# Patient Record
Sex: Male | Born: 1960 | Race: White | Hispanic: No | Marital: Single | Smoking: Former smoker
Health system: Southern US, Community
[De-identification: ages and names within clinical notes are randomized; demographics above are authoritative.]

## PROBLEM LIST (undated history)

## (undated) DIAGNOSIS — E119 Type 2 diabetes mellitus without complications: Secondary | ICD-10-CM

## (undated) DIAGNOSIS — L570 Actinic keratosis: Secondary | ICD-10-CM

## (undated) DIAGNOSIS — E785 Hyperlipidemia, unspecified: Secondary | ICD-10-CM

## (undated) HISTORY — DX: Type 2 diabetes mellitus without complications: E11.9

## (undated) HISTORY — PX: OTHER SURGICAL HISTORY: SHX169

## (undated) HISTORY — DX: Hyperlipidemia, unspecified: E78.5

## (undated) HISTORY — DX: Actinic keratosis: L57.0

---

## 2020-12-30 ENCOUNTER — Emergency Department: Payer: BC Managed Care – PPO

## 2020-12-30 ENCOUNTER — Inpatient Hospital Stay
Admission: EM | Admit: 2020-12-30 | Discharge: 2020-12-31 | DRG: 440 | Disposition: A | Discharge status: Home / Self Care | Payer: BC Managed Care – PPO | Attending: Internal Medicine | Admitting: Internal Medicine

## 2020-12-30 ENCOUNTER — Other Ambulatory Visit: Payer: Self-pay

## 2020-12-30 ENCOUNTER — Encounter: Payer: Self-pay | Admitting: Emergency Medicine

## 2020-12-30 ENCOUNTER — Inpatient Hospital Stay: Payer: BC Managed Care – PPO

## 2020-12-30 DIAGNOSIS — K859 Acute pancreatitis without necrosis or infection, unspecified: Secondary | ICD-10-CM | POA: Diagnosis present

## 2020-12-30 DIAGNOSIS — K760 Fatty (change of) liver, not elsewhere classified: Secondary | ICD-10-CM | POA: Diagnosis present

## 2020-12-30 DIAGNOSIS — Z20822 Contact with and (suspected) exposure to covid-19: Secondary | ICD-10-CM | POA: Diagnosis present

## 2020-12-30 DIAGNOSIS — K2980 Duodenitis without bleeding: Secondary | ICD-10-CM | POA: Diagnosis present

## 2020-12-30 DIAGNOSIS — R17 Unspecified jaundice: Secondary | ICD-10-CM | POA: Diagnosis not present

## 2020-12-30 DIAGNOSIS — K858 Other acute pancreatitis without necrosis or infection: Secondary | ICD-10-CM

## 2020-12-30 DIAGNOSIS — E875 Hyperkalemia: Secondary | ICD-10-CM | POA: Diagnosis present

## 2020-12-30 DIAGNOSIS — E119 Type 2 diabetes mellitus without complications: Secondary | ICD-10-CM

## 2020-12-30 DIAGNOSIS — E785 Hyperlipidemia, unspecified: Secondary | ICD-10-CM | POA: Diagnosis present

## 2020-12-30 DIAGNOSIS — Z87891 Personal history of nicotine dependence: Secondary | ICD-10-CM

## 2020-12-30 DIAGNOSIS — E876 Hypokalemia: Secondary | ICD-10-CM

## 2020-12-30 DIAGNOSIS — R1013 Epigastric pain: Secondary | ICD-10-CM | POA: Diagnosis not present

## 2020-12-30 DIAGNOSIS — E1165 Type 2 diabetes mellitus with hyperglycemia: Secondary | ICD-10-CM | POA: Diagnosis present

## 2020-12-30 LAB — COMPREHENSIVE METABOLIC PANEL
ALT: 24 U/L (ref 0–44)
AST: 39 U/L (ref 15–41)
Albumin: 3.7 g/dL (ref 3.5–5.0)
Alkaline Phosphatase: 85 U/L (ref 38–126)
Anion gap: 16 — ABNORMAL HIGH (ref 5–15)
BUN: 12 mg/dL (ref 6–20)
CO2: 19 mmol/L — ABNORMAL LOW (ref 22–32)
Calcium: 8.9 mg/dL (ref 8.9–10.3)
Chloride: 100 mmol/L (ref 98–111)
Creatinine, Ser: 0.83 mg/dL (ref 0.61–1.24)
GFR, Estimated: >60 mL/min (ref >60)
Glucose, Bld: 228 mg/dL — ABNORMAL HIGH (ref 70–99)
Potassium: 5.8 mmol/L — ABNORMAL HIGH (ref 3.5–5.1)
Sodium: 135 mmol/L (ref 135–145)
Total Bilirubin: 2.9 mg/dL — ABNORMAL HIGH (ref 0.3–1.2)
Total Protein: 6.4 g/dL — ABNORMAL LOW (ref 6.5–8.1)

## 2020-12-30 LAB — CBC
HCT: 47.9 % (ref 39.0–52.0)
Hemoglobin: 17.6 g/dL — ABNORMAL HIGH (ref 13.0–17.0)
MCH: 33.7 pg (ref 26.0–34.0)
MCHC: 36.7 g/dL — ABNORMAL HIGH (ref 30.0–36.0)
MCV: 91.8 fL (ref 80.0–100.0)
Platelets: 214 10*3/uL (ref 150–400)
RBC: 5.22 MIL/uL (ref 4.22–5.81)
RDW: 13 % (ref 11.5–15.5)
WBC: 14.7 10*3/uL — ABNORMAL HIGH (ref 4.0–10.5)
nRBC: 0 % (ref 0.0–0.2)

## 2020-12-30 LAB — URINALYSIS, COMPLETE (UACMP) WITH MICROSCOPIC
Bilirubin Urine: NEGATIVE
Glucose, UA: >=500 mg/dL — AB
Hgb urine dipstick: NEGATIVE
Ketones, ur: 80 mg/dL — AB
Leukocytes,Ua: NEGATIVE
Nitrite: NEGATIVE
Protein, ur: NEGATIVE mg/dL
Specific Gravity, Urine: 1.041 — ABNORMAL HIGH (ref 1.005–1.030)
Squamous Epithelial / HPF: NONE SEEN (ref 0–5)
pH: 5 (ref 5.0–8.0)

## 2020-12-30 LAB — RESP PANEL BY RT-PCR (FLU A&B, COVID) ARPGX2
Influenza A by PCR: NEGATIVE
Influenza B by PCR: NEGATIVE
SARS Coronavirus 2 by RT PCR: NEGATIVE

## 2020-12-30 LAB — TROPONIN I (HIGH SENSITIVITY)
Troponin I (High Sensitivity): 12 ng/L (ref <18)
Troponin I (High Sensitivity): 10 ng/L (ref <18)

## 2020-12-30 LAB — LIPASE, BLOOD: Lipase: 1462 U/L — ABNORMAL HIGH (ref 11–51)

## 2020-12-30 MED ORDER — INSULIN ASPART 100 UNIT/ML IJ SOLN
0.0000 [IU] | Freq: Four times a day (QID) | INTRAMUSCULAR | Status: DC
  Administered 2020-12-31 (×2): 3 [IU] via SUBCUTANEOUS
  Filled 2020-12-30 (×2): qty 1

## 2020-12-30 MED ORDER — SODIUM CHLORIDE 0.9 % IV SOLN: INTRAVENOUS | Status: DC

## 2020-12-30 MED ORDER — ENOXAPARIN SODIUM 60 MG/0.6ML IJ SOSY
45.0000 mg | PREFILLED_SYRINGE | INTRAMUSCULAR | Status: DC
  Administered 2020-12-30: 45 mg via SUBCUTANEOUS
  Filled 2020-12-30: qty 0.6

## 2020-12-30 MED ORDER — KETOROLAC TROMETHAMINE 30 MG/ML IJ SOLN: 15.0000 mg | Freq: Four times a day (QID) | INTRAMUSCULAR | Status: DC | PRN

## 2020-12-30 MED ORDER — SODIUM CHLORIDE 0.9 % IV BOLUS
1000.0000 mL | Freq: Once | INTRAVENOUS | Status: AC
  Administered 2020-12-30: 1000 mL via INTRAVENOUS

## 2020-12-30 MED ORDER — ACETAMINOPHEN 325 MG PO TABS
650.0000 mg | ORAL_TABLET | Freq: Four times a day (QID) | ORAL | Status: DC | PRN
  Administered 2020-12-30: 23:00:00 650 mg via ORAL
  Filled 2020-12-30: qty 2

## 2020-12-30 MED ORDER — ONDANSETRON HCL 4 MG PO TABS: 4.0000 mg | ORAL_TABLET | Freq: Four times a day (QID) | ORAL | Status: DC | PRN

## 2020-12-30 MED ORDER — SODIUM POLYSTYRENE SULFONATE 15 GM/60ML PO SUSP
30.0000 g | Freq: Once | ORAL | Status: AC
  Administered 2020-12-30: 23:00:00 30 g via ORAL
  Filled 2020-12-30: qty 120

## 2020-12-30 MED ORDER — ACETAMINOPHEN 650 MG RE SUPP: 650.0000 mg | Freq: Four times a day (QID) | RECTAL | Status: DC | PRN

## 2020-12-30 MED ORDER — SODIUM BICARBONATE 8.4 % IV SOLN
50.0000 meq | Freq: Once | INTRAVENOUS | Status: AC
  Administered 2020-12-30: 50 meq via INTRAVENOUS
  Filled 2020-12-30: qty 50

## 2020-12-30 MED ORDER — TRAZODONE HCL 50 MG PO TABS: 25.0000 mg | ORAL_TABLET | Freq: Every evening | ORAL | Status: DC | PRN

## 2020-12-30 MED ORDER — ONDANSETRON HCL 4 MG/2ML IJ SOLN: 4.0000 mg | Freq: Four times a day (QID) | INTRAMUSCULAR | Status: DC | PRN

## 2020-12-30 MED ORDER — MORPHINE SULFATE (PF) 2 MG/ML IV SOLN: 2.0000 mg | INTRAVENOUS | Status: DC | PRN

## 2020-12-30 MED ORDER — MAGNESIUM HYDROXIDE 400 MG/5ML PO SUSP
30.0000 mL | Freq: Every day | ORAL | Status: DC | PRN
  Filled 2020-12-30: qty 30

## 2020-12-30 MED ORDER — IOHEXOL 350 MG/ML SOLN
100.0000 mL | Freq: Once | INTRAVENOUS | Status: AC | PRN
  Administered 2020-12-30: 80 mL via INTRAVENOUS

## 2020-12-30 MED ORDER — SODIUM CHLORIDE 0.9 % IV SOLN: Freq: Once | INTRAVENOUS | Status: DC

## 2020-12-30 MED ORDER — SODIUM CHLORIDE 0.9 % IV BOLUS: 1000.0000 mL | Freq: Once | INTRAVENOUS | Status: DC

## 2020-12-30 NOTE — ED Notes (Signed)
Pt to ED POV c/o abdominal pain. Pt states abdominal pain started yesterday morning and feels tender in all quadrants, Pt noted to have abdominal distention. Denies NVD at this time A&Ox4, NAD, VSS

## 2020-12-30 NOTE — ED Triage Notes (Signed)
Pt reports pain to his left upper quadrant since yesterday. Pt reports went to UC and was advised to come to the ED for evaluation.

## 2020-12-30 NOTE — ED Notes (Signed)
Care transferred, report received from [place], [place]

## 2020-12-30 NOTE — ED Notes (Addendum)
Called lab again at this time. Lab states that it is being spun down now and they have to spin it down multiple time. Explained that this blood work was the delay for his CT scan. Lab verbalized understanding at this time. Updated pt about the delay

## 2020-12-30 NOTE — ED Provider Notes (Signed)
-----------------------------------------   7:14 PM on 12/30/2020 ----------------------------------------- Patient's work-up shows acute pancreatitis CT evidence of acute pancreatitis as well as a lipase greater than 1400.  Patient continues to have epigastric pain.  We will admit the patient to the hospitalist service for bowel rest.  We will begin IV hydration.  Lab work did show mild hyperkalemia initially we will repeat this as the patient has received IV fluids.  Patient does not drink alcohol.   Harvest Dark, MD 12/30/20 [phone]

## 2020-12-30 NOTE — ED Notes (Signed)
Lab called x3 to notify about hemolyzed lab draw. Phlebotomist requested to draw labs at this time.

## 2020-12-30 NOTE — ED Notes (Signed)
Hospitalist at the bedside 
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
## 2020-12-30 NOTE — ED Notes (Signed)
Patient transported upstairs to his room via wheelchair by RN, stable at transfer

## 2020-12-30 NOTE — ED Provider Notes (Signed)
[facility] Emergency Department Provider Note  ____________________________________________   Event Date/Time   First MD Initiated Contact with Patient 12/30/20 1239     (approximate)  I have reviewed the triage vital signs and the nursing notes.   HISTORY  Chief Complaint Abdominal Pain    HPI Tyler Powers is a 60 y.o. male who is otherwise healthy comes in with abdominal pain.  Patient reports having abdominal pain that started yesterday.  He states that initially was over his entire abdomen.  He states that it was kind of in the lower abdomen and then wrapped up into the top of the abdomen.  He states that it felt more distended or bloated.  This was constant, nothing made it better, nothing made it worse.  He states it is gradually gotten better and now he just has a little bit of pain in the left upper quadrant but still feels distended.  Denies any chest pain or shortness of breath.  Last bowel movement was Friday and soft in nature.  Denies any other urinary symptoms.  Denies any abdominal surgeries.  Denies any daily alcohol drinking.  Quit smoking a few years ago.    History reviewed. No pertinent past medical history.  There are no problems to display for this patient.   History reviewed. No pertinent surgical history.  Prior to Admission medications   Not on File    Allergies Patient has no allergy information on record.  No family history on file.  Social History No alcohol, prior smoking   Review of Systems Constitutional: No fever/chills Eyes: No visual changes. ENT: No sore throat. Cardiovascular: Denies chest pain. Respiratory: Denies shortness of breath. Gastrointestinal: Abdominal pain, distention Genitourinary: Negative for dysuria. Musculoskeletal: Negative for back pain. Skin: Negative for rash. Neurological: Negative for headaches, focal weakness or numbness. All other ROS  negative ____________________________________________   PHYSICAL EXAM:  VITAL SIGNS: ED Triage Vitals  Enc Vitals Group     BP 12/30/20 0954 123/79     Pulse Rate 12/30/20 0954 92     Resp 12/30/20 0954 18     Temp 12/30/20 0954 98.2 F (36.8 C)     Temp src --      SpO2 12/30/20 0954 96 %     Weight 12/30/20 0914 200 lb (90.7 kg)     Height 12/30/20 0914 '5\' 8"'$  (1.727 m)     Head Circumference --      Peak Flow --      Pain Score 12/30/20 0914 2     Pain Loc --      Pain Edu? --      Excl. in [place]? --     Constitutional: Alert and oriented. Well appearing and in no acute distress. Eyes: Conjunctivae are normal. EOMI. Head: Atraumatic. Nose: No congestion/rhinnorhea. Mouth/Throat: Mucous membranes are moist.   Neck: No stridor. Trachea Midline. FROM Cardiovascular: Normal rate, regular rhythm. Grossly normal heart sounds.  Good peripheral circulation. Respiratory: Normal respiratory effort.  No retractions. Lungs CTAB. Gastrointestinal: Slightly distended but soft to palpation with a little bit of left upper quadrant tenderness without any rebound or guarding.  No abdominal bruits.  Musculoskeletal: No lower extremity tenderness nor edema.  No joint effusions. Neurologic:  Normal speech and language. No gross focal neurologic deficits are appreciated.  Skin:  Skin is warm, dry and intact. No rash noted. Psychiatric: Mood and affect are normal. Speech and behavior are normal. GU: Deferred   ____________________________________________   LABS (  all labs ordered are listed, but only abnormal results are displayed)  Labs Reviewed  CBC - Abnormal; Notable for the following components:      Result Value   WBC 14.7 (*)    Hemoglobin 17.6 (*)    MCHC 36.7 (*)    All other components within normal limits  URINALYSIS, COMPLETE (UACMP) WITH MICROSCOPIC - Abnormal; Notable for the following components:   Color, Urine YELLOW (*)    APPearance CLEAR (*)    Specific Gravity,  Urine 1.041 (*)    Glucose, UA >=500 (*)    Ketones, ur 80 (*)    Bacteria, UA RARE (*)    All other components within normal limits  COMPREHENSIVE METABOLIC PANEL  LIPASE, BLOOD   ____________________________________________   ED ECG REPORT I, Vanessa [place], the attending physician, personally viewed and interpreted this ECG.  Normal sinus rate of 89, no ST elevation, T wave version lead III, normal intervals ____________________________________________  RADIOLOGY Robert Bellow, personally viewed and evaluated these images (plain radiographs) as part of my medical decision making, as well as reviewing the written report by the radiologist.  ED MD interpretation: pending   Official radiology report(s): No results found.  ____________________________________________   PROCEDURES  Procedure(s) performed (including Critical Care):  .1-3 Lead EKG Interpretation  Date/Time: 12/30/2020 4:16 PM Performed by: Vanessa Rincon, MD Authorized by: Vanessa Lewellen, MD     Interpretation: normal     ECG rate:  80s   ECG rate assessment: normal     Rhythm: sinus rhythm     Ectopy: none     Conduction: normal     ____________________________________________   INITIAL IMPRESSION / ASSESSMENT AND PLAN / ED COURSE  Tyler Powers was evaluated in Emergency Department on 12/30/2020 for the symptoms described in the history of present illness. He was evaluated in the context of the global COVID-19 pandemic, which necessitated consideration that the patient might be at risk for infection with the SARS-CoV-2 virus that causes COVID-19. Institutional protocols and algorithms that pertain to the evaluation of patients at risk for COVID-19 are in a state of rapid change based on information released by regulatory bodies including the CDC and federal and state organizations. These policies and algorithms were followed during the patient's care in the ED.    Patient is a well-appearing 60 year old  who comes in with abdominal distention and abdominal pain.  Does not sound like ACS but will get EKG and cardiac markers to confirm.  Labs ordered to evaluate for choledocholithiasis, pancreatitis.  Will get CT abdomen to evaluate for obstruction, perforation or other acute pathology.  Patient is declining pain medication at this time.  We will keep patient the cardiac monitor to evaluate for any arrhythmia  Patient's labs hemolyzed multiple times leading to a delay in care.  His initial troponin was reassuring and does not sound cardiac in nature I do not feel like need to get another troponin specially given all the hemolysis.  CT imaging is pending and the rest of his labs and patient will be handed off to oncoming team         ____________________________________________   FINAL CLINICAL IMPRESSION(S) / ED DIAGNOSES   Final diagnoses:  None      MEDICATIONS GIVEN DURING THIS VISIT:  Medications - No data to display   ED Discharge Orders     None        Note:  This document was prepared using Dragon voice  recognition software and may include unintentional dictation errors.    Vanessa [place], MD 12/30/20 [phone]

## 2020-12-30 NOTE — ED Notes (Signed)
Called lab for an update on the pt's CMP/Lipase blood work, lab states that it is in process at this time.

## 2020-12-30 NOTE — ED Notes (Signed)
Lab at bedside

## 2020-12-30 NOTE — ED Notes (Signed)
CT scan called at this time to get pt for CT scan, lab work has been resulted.

## 2020-12-31 ENCOUNTER — Encounter: Payer: Self-pay | Admitting: Family Medicine

## 2020-12-31 DIAGNOSIS — K859 Acute pancreatitis without necrosis or infection, unspecified: Principal | ICD-10-CM

## 2020-12-31 LAB — COMPREHENSIVE METABOLIC PANEL WITH GFR
ALT: 12 U/L (ref 0–44)
AST: 24 U/L (ref 15–41)
Albumin: 3.3 g/dL — ABNORMAL LOW (ref 3.5–5.0)
Alkaline Phosphatase: 78 U/L (ref 38–126)
Anion gap: 13 (ref 5–15)
BUN: 11 mg/dL (ref 6–20)
CO2: 21 mmol/L — ABNORMAL LOW (ref 22–32)
Calcium: 8.6 mg/dL — ABNORMAL LOW (ref 8.9–10.3)
Chloride: 103 mmol/L (ref 98–111)
Creatinine, Ser: 0.71 mg/dL (ref 0.61–1.24)
GFR, Estimated: >60 mL/min
Glucose, Bld: 193 mg/dL — ABNORMAL HIGH (ref 70–99)
Potassium: 4.4 mmol/L (ref 3.5–5.1)
Sodium: 137 mmol/L (ref 135–145)
Total Bilirubin: 1.9 mg/dL — ABNORMAL HIGH (ref 0.3–1.2)
Total Protein: 6.1 g/dL — ABNORMAL LOW (ref 6.5–8.1)

## 2020-12-31 LAB — CBC
HCT: 45.9 % (ref 39.0–52.0)
Hemoglobin: 16 g/dL (ref 13.0–17.0)
MCH: 32.2 pg (ref 26.0–34.0)
MCHC: 34.9 g/dL (ref 30.0–36.0)
MCV: 92.4 fL (ref 80.0–100.0)
Platelets: 202 K/uL (ref 150–400)
RBC: 4.97 MIL/uL (ref 4.22–5.81)
RDW: 13.3 % (ref 11.5–15.5)
WBC: 13 K/uL — ABNORMAL HIGH (ref 4.0–10.5)
nRBC: 0 % (ref 0.0–0.2)

## 2020-12-31 LAB — HEMOGLOBIN A1C
Hgb A1c MFr Bld: 12.8 % — ABNORMAL HIGH (ref 4.8–5.6)
Mean Plasma Glucose: 320.66 mg/dL

## 2020-12-31 LAB — LIPID PANEL
Cholesterol: 530 mg/dL — ABNORMAL HIGH (ref 0–200)
HDL: 23 mg/dL — ABNORMAL LOW (ref >40)
LDL Cholesterol: UNDETERMINED mg/dL (ref 0–99)
Triglycerides: 1554 mg/dL — ABNORMAL HIGH (ref <150)
VLDL: UNDETERMINED mg/dL (ref 0–40)

## 2020-12-31 LAB — LDL CHOLESTEROL, DIRECT: Direct LDL: 102.1 mg/dL — ABNORMAL HIGH (ref 0–99)

## 2020-12-31 LAB — GLUCOSE, CAPILLARY
Glucose-Capillary: 177 mg/dL — ABNORMAL HIGH (ref 70–99)
Glucose-Capillary: 228 mg/dL — ABNORMAL HIGH (ref 70–99)
Glucose-Capillary: 234 mg/dL — ABNORMAL HIGH (ref 70–99)
Glucose-Capillary: 236 mg/dL — ABNORMAL HIGH (ref 70–99)

## 2020-12-31 LAB — HIV ANTIBODY (ROUTINE TESTING W REFLEX): HIV Screen 4th Generation wRfx: NONREACTIVE

## 2020-12-31 LAB — LIPASE, BLOOD: Lipase: 77 U/L — ABNORMAL HIGH (ref 11–51)

## 2020-12-31 MED ORDER — INSULIN ASPART 100 UNIT/ML IJ SOLN: 0.0000 [IU] | Freq: Every day | INTRAMUSCULAR | Status: DC

## 2020-12-31 MED ORDER — LIVING WELL WITH DIABETES BOOK
Freq: Once | Status: AC
  Filled 2020-12-31: qty 1

## 2020-12-31 MED ORDER — OMEGA-3-ACID ETHYL ESTERS 1 G PO CAPS: 2.0000 g | ORAL_CAPSULE | Freq: Two times a day (BID) | ORAL | Status: DC

## 2020-12-31 MED ORDER — MORPHINE SULFATE (PF) 2 MG/ML IV SOLN
2.0000 mg | INTRAVENOUS | Status: DC | PRN
Start: 2020-12-31 — End: 2020-12-31

## 2020-12-31 MED ORDER — INSULIN GLARGINE 100 UNIT/ML SOLOSTAR PEN: 10.0000 [IU] | PEN_INJECTOR | Freq: Every day | SUBCUTANEOUS | 11 refills | Status: DC

## 2020-12-31 MED ORDER — ROSUVASTATIN CALCIUM 10 MG PO TABS: 10.0000 mg | ORAL_TABLET | Freq: Every day | ORAL | Status: DC

## 2020-12-31 MED ORDER — INSULIN ASPART 100 UNIT/ML IJ SOLN
0.0000 [IU] | Freq: Three times a day (TID) | INTRAMUSCULAR | Status: DC
  Administered 2020-12-31: 3 [IU] via SUBCUTANEOUS
  Administered 2020-12-31: 16:00:00 5 [IU] via SUBCUTANEOUS
  Filled 2020-12-31 (×2): qty 1

## 2020-12-31 MED ORDER — METFORMIN HCL ER 500 MG PO TB24: 500.0000 mg | ORAL_TABLET | Freq: Two times a day (BID) | ORAL | 1 refills | Status: DC

## 2020-12-31 MED ORDER — PEN NEEDLES 31G X 5 MM MISC: 10.0000 [IU] | Freq: Every day | 1 refills | Status: DC

## 2020-12-31 MED ORDER — INSULIN ASPART 100 UNIT/ML IJ SOLN
4.0000 [IU] | Freq: Three times a day (TID) | INTRAMUSCULAR | Status: DC
  Administered 2020-12-31 (×2): 4 [IU] via SUBCUTANEOUS
  Filled 2020-12-31 (×2): qty 1

## 2020-12-31 MED ORDER — BLOOD GLUCOSE MONITOR KIT: PACK | 0 refills | Status: AC

## 2020-12-31 MED ORDER — OMEGA-3-ACID ETHYL ESTERS 1 G PO CAPS: 2.0000 g | ORAL_CAPSULE | Freq: Two times a day (BID) | ORAL | 1 refills | Status: DC

## 2020-12-31 MED ORDER — ROSUVASTATIN CALCIUM 10 MG PO TABS
10.0000 mg | ORAL_TABLET | Freq: Every day | ORAL | 1 refills | Status: DC
Start: 2020-12-31 — End: 2021-02-14

## 2020-12-31 NOTE — Plan of Care (Signed)
Nutrition Education Note  RD consulted for nutrition education regarding new onset type 2 diabetes.   Lab Results  Component Value Date   HGBA1C 12.8 (H) 12/31/2020   RD provided "Carbohydrate Counting for People with Diabetes" handout from the Academy of [employer] and Dietetics. Discussed different food groups and their effects on blood sugar, emphasizing carbohydrate-containing foods. Provided list of carbohydrates and recommended serving sizes of common foods.  Highlighted the importance of drinking diet drinks versus regular drinks for blood glucose control.  Discussed importance of controlled and consistent carbohydrate intake throughout the day. Provided examples of ways to balance meals/snacks and encouraged intake of high-fiber, whole grain complex carbohydrates. Teach back method used.  Expect fair compliance.  Body mass index is 30.41 kg/m. Pt meets criteria for Obesity Class 1 based on current BMI.  Current diet order is clear liquids, patient is consuming approximately 0% of meals at this time. Labs and medications reviewed.   No further nutrition interventions warranted at this time. RD contact information provided. If additional nutrition issues arise, please re-consult RD.  Derrel Nip, RD, LDN (she/her/hers) Registered Dietitian I After-Hours/Weekend Pager # in [place]

## 2020-12-31 NOTE — Progress Notes (Signed)
Pt educated on use of glucometer, also able to demonstrate effective self administration of insulin injection

## 2020-12-31 NOTE — Discharge Instructions (Signed)

## 2020-12-31 NOTE — Discharge Summary (Signed)
Physician Discharge Summary  Tyler Powers BPZ:[id number] DOB: [birth year] DOA: 12/30/2020  PCP: Pcp, No  Admit date: 12/30/2020 Discharge date: 12/31/2020  Admitted From: Home Disposition: Home  Recommendations for Outpatient Follow-up:  Follow up with PCP in 1-2 weeks Please obtain BMP/CBC in one week Please follow up on the following pending results: None  Home Health: No Equipment/Devices: None Discharge Condition: Stable CODE STATUS: Full Diet recommendation: Heart Healthy / Carb Modified   Brief/Interim Summary: Tyler Powers is a 60 y.o. Caucasian male with history of diet managed dyslipidemia, who presented to the emergency room with acute onset of epigastric and mid abdominal dull aching pain graded 7-8/10 in severity with no radiation yesterday that is slightly better today.  He denied any nausea or vomiting or diarrhea or constipation.  No fever or chills.   Found to have mild leukocytosis, hyperglycemia and CT abdomen and pelvis concerning for uncomplicated pancreatitis and reactive duodenitis along with some hepatic steatosis.  Right upper quadrant ultrasound with hepatomegaly and hepatic steatosis.  Patient received IV fluid.  A1c came back 12.8.  Lipid panel with elevated total cholesterol and triglyceride above 500.  Patient eats fried food and fast food.  Patient was started on insulin and discharged on metformin along with Lantus.  Diabetes coordinator was also consulted to provide diabetic education.  He was also provided with glucometer and needs to follow-up very closely with PCP for further management and better control of diabetes.  Based on his lipid panel he was also started on Crestor and omega-3 and will need to have a follow-up with PCP.  Patient was able to tolerate diet without any difficulty.  Abdominal pain resolved.  Discharge Diagnoses:  Active Problems:   Acute pancreatitis New onset diabetes mellitus Triglyceridemia Dyslipidemia  Discharge  Instructions  Discharge Instructions     Diet - low sodium heart healthy   Complete by: As directed    Discharge instructions   Complete by: As directed    It was pleasure taking care of you. You had some inflammation around your pancreas, please keep yourself well-hydrated and eat softer diet for next few days.  Avoid fried food. Your cholesterol and triglycerides are also high-you are being started on medication. You are also found to have diabetes with very elevated A1c of 12.8.  Goal A1c in your case should be 6. You are being started on metformin and low-dose insulin.  You need to follow-up with your primary care doctor very closely for further management of your diabetes. You are also being given prescription for glucometer, please check your blood glucose level 2-3 times a day and take your glucometer with you for your primary doctor appointment so they can monitor your blood glucose closely and adjust your medications accordingly.   Increase activity slowly   Complete by: As directed       Allergies as of 12/31/2020   No Known Allergies      Medication List     TAKE these medications    blood glucose meter kit and supplies Kit Dispense based on patient and insurance preference. Use up to four times daily as directed.   ibuprofen 200 MG tablet Commonly known as: ADVIL Take 200 mg by mouth every 6 (six) hours as needed.   insulin glargine 100 UNIT/ML Solostar Pen Commonly known as: LANTUS Inject 10 Units into the skin at bedtime.   metFORMIN 500 MG 24 hr tablet Commonly known as: GLUCOPHAGE-XR Take 1 tablet (500 mg total) by mouth in the  morning and at bedtime.   omega-3 acid ethyl esters 1 g capsule Commonly known as: LOVAZA Take 2 capsules (2 g total) by mouth 2 (two) times daily.   Pen Needles 31G X 5 MM Misc 10 Units by Does not apply route at bedtime.   rosuvastatin 10 MG tablet Commonly known as: CRESTOR Take 1 tablet (10 mg total) by mouth daily.         No Known Allergies  Consultations: None  Procedures/Studies: CT ABDOMEN PELVIS W CONTRAST  Result Date: 12/30/2020 CLINICAL DATA:  Abdominal distension EXAM: CT ABDOMEN AND PELVIS WITH CONTRAST TECHNIQUE: Multidetector CT imaging of the abdomen and pelvis was performed using the standard protocol following bolus administration of intravenous contrast. CONTRAST:  17m OMNIPAQUE IOHEXOL 350 MG/ML SOLN COMPARISON:  None. FINDINGS: Lower chest: Bibasilar atelectasis Hepatobiliary: Hepatic steatosis. Focal fatty sparing adjacent to the gallbladder fossa. Gallbladder is unremarkable. No extrahepatic biliary ductal dilation. Portal vein is patent. Pancreas: There is fat stranding and edematous changes of the pancreas. This is most particularly prevalent in the pancreatic head and uncinate process. No evidence of pseudocyst formation or pseudoaneurysm formation. Pancreas homogeneously enhances. Spleen: Unremarkable. Adrenals/Urinary Tract: Adrenal glands are unremarkable. Kidneys enhance symmetrically. No hydronephrosis. No obstructing nephrolithiasis. Multiple subcentimeter hypodense masses bilaterally are too small to accurately characterize. Bladder is unremarkable. Stomach/Bowel: Mild bowel wall thickening of the third portion of the duodenum, likely reactive. No evidence of bowel obstruction. Appendix is normal. Vascular/Lymphatic: No significant vascular findings are present. No enlarged abdominal or pelvic lymph nodes. Reproductive: Prostate is present. Other: Small fat containing RIGHT inguinal hernia. Musculoskeletal: No acute or significant osseous findings. IMPRESSION: 1. Constellation of findings are consistent with acute uncomplicated interstitial pancreatitis. There is adjacent reactive duodenitis. 2. Hepatic steatosis. Electronically Signed   By: SValentino SaxonM.D.   On: 12/30/2020 18:57   [place]DOM[place] LIMITED RUQ (LIVER/GB)  Result Date: 12/30/2020 CLINICAL DATA:  Right upper  abdominal pain. EXAM: ULTRASOUND ABDOMEN LIMITED RIGHT UPPER QUADRANT COMPARISON:  CT abdomen pelvis 12/30/2020 FINDINGS: Gallbladder: No gallstones or wall thickening visualized. No sonographic Murphy sign noted by sonographer. Common bile duct: Diameter: 5 mm Liver: Enlarged in size. No focal lesion identified. Increased parenchymal echogenicity. Portal vein is patent on color Doppler imaging with normal direction of blood flow towards the liver. Other: None. IMPRESSION: Hepatomegaly and hepatic steatosis. Please note limited evaluation for focal hepatic masses in a patient with hepatic steatosis due to decreased penetration of the acoustic ultrasound waves. Electronically Signed   By: MIven FinnM.D.   On: 12/30/2020 20:32    Subjective: Patient was seen and examined today.  Denies any nausea, vomiting or abdominal pain.  Feeling hungry and wants to eat.  A friend at bedside. Patient does not drink except 1-2 times a year.  No binge drinking. He does not follow-up with any doctor on a regular basis and does not use any chronic medication.  He was not aware about his blood glucose level.  He was provided with education regarding diabetes.  Discharge Exam: Vitals:   12/31/20 1127 12/31/20 1622  BP: 124/78 123/70  Pulse: 87 92  Resp: 12 18  Temp: 98.2 F (36.8 C) 98.9 F (37.2 C)  SpO2:  96%   Vitals:   12/31/20 0507 12/31/20 0800 12/31/20 1127 12/31/20 1622  BP: 110/75 113/76 124/78 123/70  Pulse: 82 86 87 92  Resp: 18 20 12 18   Temp: 98.4 F (36.9 C) 97.8 F (36.6 C) 98.2 F (36.8 C)  98.9 F (37.2 C)  TempSrc:  Oral Oral Oral  SpO2: 96% 95%  96%  Weight:      Height:        General: Pt is alert, awake, not in acute distress Cardiovascular: RRR, S1/S2 +, no rubs, no gallops Respiratory: CTA bilaterally, no wheezing, no rhonchi Abdominal: Soft, NT, ND, bowel sounds + Extremities: no edema, no cyanosis   The results of significant diagnostics from this hospitalization  (including imaging, microbiology, ancillary and laboratory) are listed below for reference.    Microbiology: Recent Results (from the past 240 hour(s))  Resp Panel by RT-PCR (Flu A&B, Covid) Nasopharyngeal Swab     Status: None   Collection Time: 12/30/20  9:29 PM   Specimen: Nasopharyngeal Swab; Nasopharyngeal(NP) swabs in vial transport medium  Result Value Ref Range Status   SARS Coronavirus 2 by RT PCR NEGATIVE NEGATIVE Final    Comment: (NOTE) SARS-CoV-2 target nucleic acids are NOT DETECTED.  The SARS-CoV-2 RNA is generally detectable in upper respiratory specimens during the acute phase of infection. The lowest concentration of SARS-CoV-2 viral copies this assay can detect is 138 copies/mL. A negative result does not preclude SARS-Cov-2 infection and should not be used as the sole basis for treatment or other patient management decisions. A negative result may occur with  improper specimen collection/handling, submission of specimen other than nasopharyngeal swab, presence of viral mutation(s) within the areas targeted by this assay, and inadequate number of viral copies(<138 copies/mL). A negative result must be combined with clinical observations, patient history, and epidemiological information. The expected result is Negative.  Fact Sheet for Patients:  EntrepreneurPulse.com.au  Fact Sheet for Healthcare Providers:  IncredibleEmployment.be  This test is no t yet approved or cleared by the [place] FDA and  has been authorized for detection and/or diagnosis of SARS-CoV-2 by FDA under an Emergency Use Authorization (EUA). This EUA will remain  in effect (meaning this test can be used) for the duration of the COVID-19 declaration under Section 564(b)(1) of the Act, 21 U.S.C.section 360bbb-3(b)(1), unless the authorization is terminated  or revoked sooner.       Influenza A by PCR NEGATIVE NEGATIVE Final   Influenza B by PCR  NEGATIVE NEGATIVE Final    Comment: (NOTE) The Xpert Xpress SARS-CoV-2/FLU/RSV plus assay is intended as an aid in the diagnosis of influenza from Nasopharyngeal swab specimens and should not be used as a sole basis for treatment. Nasal washings and aspirates are unacceptable for Xpert Xpress SARS-CoV-2/FLU/RSV testing.  Fact Sheet for Patients: EntrepreneurPulse.com.au  Fact Sheet for Healthcare Providers: IncredibleEmployment.be  This test is not yet approved or cleared by the [place] FDA and has been authorized for detection and/or diagnosis of SARS-CoV-2 by FDA under an Emergency Use Authorization (EUA). This EUA will remain in effect (meaning this test can be used) for the duration of the COVID-19 declaration under Section 564(b)(1) of the Act, 21 U.S.C. section 360bbb-3(b)(1), unless the authorization is terminated or revoked.  Performed at [facility], [address] [place], [place] [address]      Labs: BNP (last 3 results) No results for input(s): BNP in the last 8760 hours. Basic Metabolic Panel: Recent Labs  Lab 12/30/20 1505 12/31/20 0401  NA 135 137  K 5.8* 4.4  CL 100 103  CO2 19* 21*  GLUCOSE 228* 193*  BUN 12 11  CREATININE 0.83 0.71  CALCIUM 8.9 8.6*   Liver Function Tests: Recent Labs  Lab 12/30/20 1505 12/31/20 0401  AST 39 24  ALT 24 12  ALKPHOS 85 78  BILITOT 2.9* 1.9*  PROT 6.4* 6.1*  ALBUMIN 3.7 3.3*   Recent Labs  Lab 12/30/20 1505 12/31/20 0401  LIPASE 1,462* 77*   No results for input(s): AMMONIA in the last 168 hours. CBC: Recent Labs  Lab 12/30/20 0957 12/31/20 0401  WBC 14.7* 13.0*  HGB 17.6* 16.0  HCT 47.9 45.9  MCV 91.8 92.4  PLT 214 202   Cardiac Enzymes: No results for input(s): CKTOTAL, CKMB, CKMBINDEX, TROPONINI in the last 168 hours. BNP: Invalid input(s): POCBNP CBG: Recent Labs  Lab 12/31/20 0007 12/31/20 0838 12/31/20 1235 12/31/20 1545   GLUCAP 236* 228* 177* 234*   D-Dimer No results for input(s): DDIMER in the last 72 hours. Hgb A1c Recent Labs    12/31/20 0401  HGBA1C 12.8*   Lipid Profile Recent Labs    12/31/20 0401  CHOL 530*  HDL 23*  LDLCALC UNABLE TO CALCULATE IF TRIGLYCERIDE OVER 400 mg/dL  TRIG 1,554*  CHOLHDL NOT REPORTED DUE TO HIGH TRIGLYCERIDES  LDLDIRECT 102.1*   Thyroid function studies No results for input(s): TSH, T4TOTAL, T3FREE, THYROIDAB in the last 72 hours.  Invalid input(s): FREET3 Anemia work up No results for input(s): VITAMINB12, FOLATE, FERRITIN, TIBC, IRON, RETICCTPCT in the last 72 hours. Urinalysis    Component Value Date/Time   COLORURINE YELLOW (A) 12/30/2020 0957   APPEARANCEUR CLEAR (A) 12/30/2020 0957   LABSPEC 1.041 (H) 12/30/2020 0957   PHURINE 5.0 12/30/2020 0957   GLUCOSEU >=500 (A) 12/30/2020 0957   HGBUR NEGATIVE 12/30/2020 0957   BILIRUBINUR NEGATIVE 12/30/2020 0957   KETONESUR 80 (A) 12/30/2020 0957   PROTEINUR NEGATIVE 12/30/2020 0957   NITRITE NEGATIVE 12/30/2020 0957   LEUKOCYTESUR NEGATIVE 12/30/2020 0957   Sepsis Labs Invalid input(s): PROCALCITONIN,  WBC,  LACTICIDVEN Microbiology Recent Results (from the past 240 hour(s))  Resp Panel by RT-PCR (Flu A&B, Covid) Nasopharyngeal Swab     Status: None   Collection Time: 12/30/20  9:29 PM   Specimen: Nasopharyngeal Swab; Nasopharyngeal(NP) swabs in vial transport medium  Result Value Ref Range Status   SARS Coronavirus 2 by RT PCR NEGATIVE NEGATIVE Final    Comment: (NOTE) SARS-CoV-2 target nucleic acids are NOT DETECTED.  The SARS-CoV-2 RNA is generally detectable in upper respiratory specimens during the acute phase of infection. The lowest concentration of SARS-CoV-2 viral copies this assay can detect is 138 copies/mL. A negative result does not preclude SARS-Cov-2 infection and should not be used as the sole basis for treatment or other patient management decisions. A negative result may  occur with  improper specimen collection/handling, submission of specimen other than nasopharyngeal swab, presence of viral mutation(s) within the areas targeted by this assay, and inadequate number of viral copies(<138 copies/mL). A negative result must be combined with clinical observations, patient history, and epidemiological information. The expected result is Negative.  Fact Sheet for Patients:  EntrepreneurPulse.com.au  Fact Sheet for Healthcare Providers:  IncredibleEmployment.be  This test is no t yet approved or cleared by the [place] FDA and  has been authorized for detection and/or diagnosis of SARS-CoV-2 by FDA under an Emergency Use Authorization (EUA). This EUA will remain  in effect (meaning this test can be used) for the duration of the COVID-19 declaration under Section 564(b)(1) of the Act, 21 U.S.C.section 360bbb-3(b)(1), unless the authorization is terminated  or revoked sooner.       Influenza A by PCR NEGATIVE NEGATIVE Final   Influenza B  by PCR NEGATIVE NEGATIVE Final    Comment: (NOTE) The Xpert Xpress SARS-CoV-2/FLU/RSV plus assay is intended as an aid in the diagnosis of influenza from Nasopharyngeal swab specimens and should not be used as a sole basis for treatment. Nasal washings and aspirates are unacceptable for Xpert Xpress SARS-CoV-2/FLU/RSV testing.  Fact Sheet for Patients: EntrepreneurPulse.com.au  Fact Sheet for Healthcare Providers: IncredibleEmployment.be  This test is not yet approved or cleared by the [place] FDA and has been authorized for detection and/or diagnosis of SARS-CoV-2 by FDA under an Emergency Use Authorization (EUA). This EUA will remain in effect (meaning this test can be used) for the duration of the COVID-19 declaration under Section 564(b)(1) of the Act, 21 U.S.C. section 360bbb-3(b)(1), unless the authorization is terminated  or revoked.  Performed at [facility], [address] [place], [place] [address]     Time coordinating discharge: Over 30 minutes  SIGNED:  Lorella Nimrod, MD  [facility] 12/31/2020, 4:42 PM  If 7PM-7AM, please contact night-coverage [email or web address]  This record has been created using Systems analyst. Errors have been sought and corrected,but may not always be located. Such creation errors do not reflect on the standard of care.

## 2020-12-31 NOTE — Progress Notes (Addendum)
Inpatient Diabetes Program Recommendations  AACE/ADA: New Consensus Statement on Inpatient Glycemic Control (2015)  Target Ranges:  Prepandial:   less than 140 mg/dL      Peak postprandial:   less than 180 mg/dL (1-2 hours)      Critically ill patients:  140 - 180 mg/dL  Results for Tyler Powers, Tyler Powers (MRN [id number]) as of 12/31/2020 09:52  Ref. Range 12/31/2020 00:07 12/31/2020 08:38  Glucose-Capillary Latest Ref Range: 70 - 99 mg/dL 236 (H)  3 units Novolog  228 (H)  3 units Novolog   Results for Tyler Powers (MRN [id number]) as of 12/31/2020 09:52  Ref. Range 12/30/2020 13:55 12/30/2020 15:05 12/31/2020 04:01  Lipase Latest Ref Range: 11 - 51 U/L  1,462 (H) 77 (H)  Results for Tyler Powers (MRN [id number]) as of 12/31/2020 10:13  Ref. 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  Upon investigation of chart, I saw pt went to the [facility] in August 2021 for DOT physical and his A1c at that visit was 11.9%--Do not see that the practitioner at the [facility] diagnosed him with diabetes or treated him for diabetes.  His cholesterol and TG levels at that clinic visit appt last year were also markedly elevated.  Pt denied being told he had diabetes 1 year ago.  Does not have family history of diabetes.  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  Very sedentary but does bring food from home and does not eat at fast food restaurants.  Has not  seen primary care provider since childhood.  Discussed with pt that his high cholesterol and TG levels and pancreatitis may have led to the diagnosis of diabetes.  We discussed how pancreatitis affects the pancreas and that the pancreas os responsible for insulin production.  Explained basic pathophysiology of normal metabolism and diabetes and also reviewed detrimental effects of sustained hyperglycemia on the organs over times.  Reviewed high and low CBG symptoms and treatment.  Also reviewed healthy CBG levels for home.  Discussed with pt current A1c of 12.8% (explained what A1c is, what it measures, goal A1c, etc).  Strongly encouraged pt to get established with PCP after d/c--Alerted pt that I have consulted TOC team to help pt find PCP after d/c.  Also strongly encouraged pt to consider getting established with ENDO team after d/c for further diabetes management.  Discussed with pt that he can control his glucose levels at home with nutrition, exercise, and medications.  Unsure what meds the MD is considering at this point.  Discussed with pt that if he needs insulin for home, he may not be able to work until he is cleared safe to drive per Engineer, manufacturing guidelines.  Pt was instructed to call his employer to let them know he was diagnosed with diabetes and to see what their policy is on insulin use while driving a truck.  Also encouraged pt to start  reading the living well with diabetes book and let pt know I have ordered a RD consult for diet education.  Discussed with pt the importance of avoidance of beverages with sugar/carbohydrates and the importance of monitoring carbohydrate intake (provided brief explanation of carb containing foods and how much pt should be consuming).  Have sent [place] to Dr. Reesa Chew inquiring about what kind of meds she is thinking about sending pt home on and asked RN caring for pt to begin insulin teaching and CBG meter teaching.   --Will follow  patient during hospitalization--  Wyn Quaker RN, MSN, CDE Diabetes Coordinator Inpatient Glycemic Control Team Team Pager: [phone] (8a-5p)

## 2020-12-31 NOTE — Progress Notes (Signed)
Pt being discharged home, discharge instructions reviewed with pt, diabetes education provided, states and demonstrates understanding, tolerates diet, no complaints at discharge

## 2020-12-31 NOTE — TOC Progression Note (Signed)
Transition of Care [facility]) - Progression Note    Patient Details  Name: Tyler Powers MRN: [id number] Date of Birth: 1961-02-05  Transition of Care [facility]) CM/SW [place], RN Phone Number: 12/31/2020, 4:23 PM  Clinical Narrative: Patient lives alone, however he states he can call friends and family if he needs assistance.  He is able to drive to his own appointments, and to the pharmacy to obtain medication.  He states he is able to take medications as prescribed.  He plans to return home on discharge, and has no concerns.   Patient states he has an understanding of the new diagnosis and that staff has been very through with their instructions.  He is aware that he should call his physician with any questions or concerns, and states he is comfortable doing so.         Expected Discharge Plan and Services                                                 Social Determinants of Health (SDOH) Interventions    Readmission Risk Interventions No flowsheet data found.

## 2021-01-04 ENCOUNTER — Other Ambulatory Visit: Payer: Self-pay

## 2021-01-04 ENCOUNTER — Encounter: Payer: Self-pay | Admitting: Nurse Practitioner

## 2021-01-04 ENCOUNTER — Ambulatory Visit: Payer: BC Managed Care – PPO | Admitting: Nurse Practitioner

## 2021-01-04 VITALS — BP 112/76 | HR 90 | Temp 98.3°F | Resp 16 | Ht 68.0 in | Wt 192.8 lb

## 2021-01-04 DIAGNOSIS — Z7689 Persons encountering health services in other specified circumstances: Secondary | ICD-10-CM | POA: Diagnosis not present

## 2021-01-04 DIAGNOSIS — E782 Mixed hyperlipidemia: Secondary | ICD-10-CM | POA: Diagnosis not present

## 2021-01-04 DIAGNOSIS — E1165 Type 2 diabetes mellitus with hyperglycemia: Secondary | ICD-10-CM | POA: Diagnosis not present

## 2021-01-04 MED ORDER — DULAGLUTIDE 1.5 MG/0.5ML ~~LOC~~ SOAJ: 1.5000 mg | SUBCUTANEOUS | 1 refills | Status: DC

## 2021-01-04 NOTE — Progress Notes (Signed)
[facility] [place], [place] [address]  Internal MEDICINE  Office Visit Note  Patient Name: Tyler Powers  [id number]  [id number]  Date of Service: 01/04/2021   Complaints/HPI Pt is here for establishment of PCP. Chief Complaint  Patient presents with   New Patient (Initial Visit)   Diabetes   Quality Metric Gaps    AWV   HPI Tyler Powers presents for a new patient visit to establish care. He has a history of hyperlipidemia and was recently diagnosed with diabetes. He was sent home from the hospital on 8/29 with a glucose meter. He was started on metformin 500 mg twice daily, lantus insulin 10 units at bedtime, rosuvastatin 10 mg daily and 2 grams fish oil twice daily. Glucose levels at home have been predominantly in the 200s, and he was 360 around lunch time yesterday. His A1C was 12.8. 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 He lives at home alone. He has been working on diet modifications, decreasing high carb and high sugar foods in his diet. He is a former smoker, quit 6 years ago, rarely drinks alcohol and denies recreational drug use. He denies any pain. He main concern is getting his diabetes under control. He has FMLA papers that he will bring by to have filled out since he cannot work until he gets his diabetes under control.  His labs in the hospital showed mild hypocalcemia.    Current Medication: Outpatient Encounter Medications as of 01/04/2021  Medication Sig   blood glucose meter kit and supplies KIT Dispense based on patient and insurance preference. Use up to four times daily as directed.   [START ON 01/25/2021] Dulaglutide 1.5 MG/0.5ML SOPN Inject 1.5 mg into the skin once a week.   ibuprofen (ADVIL) 200 MG tablet Take 200 mg by mouth every 6 (six) hours as needed.   insulin glargine (LANTUS) 100 UNIT/ML Solostar Pen Inject 10 Units into the skin at bedtime.   Insulin Pen Needle  (PEN NEEDLES) 31G X 5 MM MISC 10 Units by Does not apply route at bedtime.   metFORMIN (GLUCOPHAGE-XR) 500 MG 24 hr tablet Take 1 tablet (500 mg total) by mouth in the morning and at bedtime.   omega-3 acid ethyl esters (LOVAZA) 1 g capsule Take 2 capsules (2 g total) by mouth 2 (two) times daily.   rosuvastatin (CRESTOR) 10 MG tablet Take 1 tablet (10 mg total) by mouth daily.   naproxen (NAPROSYN) 250 MG tablet Take by mouth.   No facility-administered encounter medications on file as of 01/04/2021.    Surgical History: Past Surgical History:  Procedure Laterality Date   screws in left leg Left     Medical History: Past Medical History:  Diagnosis Date   Diabetes mellitus without complication ([place])    Hyperlipidemia     Family History: Family History  Problem Relation Age of Onset   Cancer Mother    COPD Father    Asthma Father    Asthma Brother    Diabetes Maternal Aunt    Varicose Veins Maternal Grandmother     Social History   Socioeconomic History   Marital status: Single    Spouse name: Not on file   Number of children: Not on file   Years of education: Not on file   Highest education level: Not on file  Occupational History   Not on file  Tobacco Use  Smoking status: Former    Types: Cigarettes   Smokeless tobacco: Never  Substance and Sexual Activity   Alcohol use: Never   Drug use: Never   Sexual activity: Not on file  Other Topics Concern   Not on file  Social History Narrative   Not on file   Social Determinants of Health   Financial Resource Strain: Not on file  Food Insecurity: Not on file  Transportation Needs: Not on file  Physical Activity: Not on file  Stress: Not on file  Social Connections: Not on file  Intimate Partner Violence: Not on file     Review of Systems  Constitutional:  Negative for chills, fatigue and unexpected weight change.  HENT:  Negative for congestion, rhinorrhea, sneezing and sore throat.   Eyes:  Negative  for redness.  Respiratory:  Negative for cough, chest tightness and shortness of breath.   Cardiovascular:  Negative for chest pain and palpitations.  Gastrointestinal:  Negative for abdominal pain, constipation, diarrhea, nausea and vomiting.  Genitourinary:  Negative for dysuria and frequency.  Musculoskeletal:  Negative for arthralgias, back pain, joint swelling and neck pain.  Skin:  Negative for rash.  Neurological: Negative.  Negative for tremors and numbness.  Hematological:  Negative for adenopathy. Does not bruise/bleed easily.  Psychiatric/Behavioral:  Negative for behavioral problems (Depression), sleep disturbance and suicidal ideas. The patient is not nervous/anxious.    Vital Signs: BP 112/76   Pulse 90   Temp 98.3 F (36.8 C)   Resp 16   Ht 5' 8"  (1.727 m)   Wt 192 lb 12.8 oz (87.5 kg)   SpO2 97%   BMI 29.32 kg/m    Physical Exam Vitals reviewed.  Constitutional:      General: He is not in acute distress.    Appearance: Normal appearance. He is normal weight. He is not ill-appearing.  HENT:     Head: Normocephalic and atraumatic.  Eyes:     Extraocular Movements: Extraocular movements intact.     Pupils: Pupils are equal, round, and reactive to light.  Cardiovascular:     Rate and Rhythm: Normal rate and regular rhythm.     Pulses: Normal pulses.     Heart sounds: Normal heart sounds. No murmur heard. Pulmonary:     Effort: Pulmonary effort is normal. No respiratory distress.     Breath sounds: Normal breath sounds. No wheezing.  Skin:    General: Skin is warm and dry.     Capillary Refill: Capillary refill takes less than 2 seconds.  Neurological:     Mental Status: He is alert and oriented to person, place, and time.  Psychiatric:        Mood and Affect: Mood normal.        Behavior: Behavior normal.    Assessment/Plan: 1. Uncontrolled type 2 diabetes mellitus with hyperglycemia (HCC) Added trulicity 1.02 mg weekly, instructed patient on how to  use the injectable pen in office. Start with 0.75 mg weekly x4 weeks then increase to 1.5 mg weekly. Prescription sent to pharmacy. Thyroid labs also ordered.  - TSH + free T4 - Dulaglutide 1.5 MG/0.5ML SOPN; Inject 1.5 mg into the skin once a week.  Dispense: 6 mL; Refill: 1  2. Hypocalcemia Mild low calcium level, recheck BMP - Basic Metabolic Panel (BMET)  3. Mixed hyperlipidemia Taking rosuvastatin, and fish oil supplement.  - TSH + free T4  4. Encounter to establish care with new doctor Needs to get glucose levels under control.  Will check with patient at follow up visit for routine screening colonoscopy. Will check urine microalbumin at next office visit. Needs annual wellness visit too.     General Counseling: Dreama Saa understanding of the findings of todays visit and agrees with plan of treatment. I have discussed any further diagnostic evaluation that may be needed or ordered today. We also reviewed his medications today. he has been encouraged to call the office with any questions or concerns that should arise related to todays visit.    Counseling:  [place] Controlled Substance Database was reviewed by me.  Orders Placed This Encounter  Procedures   Basic Metabolic Panel (BMET)   TSH + free T4    Meds ordered this encounter  Medications   Dulaglutide 1.5 MG/0.5ML SOPN    Sig: Inject 1.5 mg into the skin once a week.    Dispense:  6 mL    Refill:  1    Return in about 4 weeks (around 02/01/2021) for F/U bring home glucose log book, eval new med trulicity, Ilo Beamon PCP.  Time spent:30 Minutes  This patient was seen by Jonetta Osgood, FNP-C in collaboration with Dr. Clayborn Bigness as a part of collaborative care agreement.    Shauna Bodkins R. Valetta Fuller, MSN, FNP-C Internal Medicine

## 2021-01-05 ENCOUNTER — Encounter: Payer: Self-pay | Admitting: Nurse Practitioner

## 2021-01-09 ENCOUNTER — Telehealth: Payer: Self-pay

## 2021-01-09 ENCOUNTER — Telehealth: Payer: Self-pay | Admitting: Internal Medicine

## 2021-01-09 NOTE — Telephone Encounter (Signed)
FMLA paperwork dropped off at the front desk by patient. Patient was advised to keep record of his blood sugar readings and bring with him to his next scheduled appointment and paperwork will be completed at that time. Paperwork was placed on [place] desk.

## 2021-01-16 LAB — BASIC METABOLIC PANEL
BUN/Creatinine Ratio: 16 (ref 9–20)
BUN: 14 mg/dL (ref 6–24)
CO2: 23 mmol/L (ref 20–29)
Calcium: 9.8 mg/dL (ref 8.7–10.2)
Chloride: 97 mmol/L (ref 96–106)
Creatinine, Ser: 0.87 mg/dL (ref 0.76–1.27)
Glucose: 161 mg/dL — ABNORMAL HIGH (ref 65–99)
Potassium: 5.2 mmol/L (ref 3.5–5.2)
Sodium: 137 mmol/L (ref 134–144)
eGFR: 100 mL/min/{1.73_m2} (ref >59)

## 2021-01-16 LAB — TSH+FREE T4
Free T4: 1.15 ng/dL (ref 0.82–1.77)
TSH: 1.3 u[IU]/mL (ref 0.450–4.500)

## 2021-01-17 ENCOUNTER — Encounter: Payer: Self-pay | Admitting: Nurse Practitioner

## 2021-01-17 ENCOUNTER — Other Ambulatory Visit: Payer: Self-pay

## 2021-01-17 ENCOUNTER — Ambulatory Visit: Payer: BC Managed Care – PPO | Admitting: Nurse Practitioner

## 2021-01-17 VITALS — BP 102/70 | HR 70 | Temp 98.3°F | Resp 16 | Ht 68.0 in | Wt 192.8 lb

## 2021-01-17 DIAGNOSIS — E1165 Type 2 diabetes mellitus with hyperglycemia: Secondary | ICD-10-CM | POA: Diagnosis not present

## 2021-01-17 DIAGNOSIS — Z8719 Personal history of other diseases of the digestive system: Secondary | ICD-10-CM

## 2021-01-17 DIAGNOSIS — E782 Mixed hyperlipidemia: Secondary | ICD-10-CM | POA: Diagnosis not present

## 2021-01-17 NOTE — Progress Notes (Signed)
[facility] [place] [address] [facility] Discharge Acute Issues Care Follow Up                                                                        Patient Demographics  Tyler Powers, is a 60 y.o. male  DOB [birth year]  MRN [id number].  Primary MD  Jonetta Osgood, NP   Reason for TCC follow Up - Acute Pancreatitis and New onset type 2 diabetes mellitus   Past Medical History:  Diagnosis Date   Diabetes mellitus without complication ([place])    Hyperlipidemia     Past Surgical History:  Procedure Laterality Date   screws in left leg Left     Walking, trulicity, increase dose in 2 more weeks. Working on diet mioedicifation Lost 8 lbs since hospital admission.   Recent HPI and Hospital Course  Tyler Powers is a 60 y.o. Caucasian male with history of diet managed dyslipidemia, who presented to the emergency room with acute onset of epigastric and mid abdominal dull aching pain graded 7-8/10 in severity with no radiation yesterday that is slightly better today.  He denied any nausea or vomiting or diarrhea or constipation.  No fever or chills.    Found to have mild leukocytosis, hyperglycemia and CT abdomen and pelvis concerning for uncomplicated pancreatitis and reactive duodenitis along with some hepatic steatosis.  Right upper quadrant ultrasound with hepatomegaly and hepatic steatosis.   Patient received IV fluid.  A1c came back 12.8.  Lipid panel with elevated total cholesterol and triglyceride above 500.  Patient eats fried food and fast food.   Patient was started on insulin and discharged on metformin along with Lantus.  Diabetes coordinator was also consulted to provide diabetic education.  He was also provided with glucometer and needs to follow-up very closely with PCP for further management and better control of diabetes.  Based on  his lipid panel he was also started on Crestor and omega-3 and will need to have a follow-up with PCP.   Patient was able to tolerate diet without any difficulty.  Abdominal pain resolved.  [facility] Acute Care Issue to be followed in the Clinic   Active Problems:   Acute pancreatitis New onset diabetes mellitus Triglyceridemia Dyslipidemia   Subjective:   Tyler Powers today has, No headache, No chest pain, No abdominal pain - No Nausea, No new weakness tingling or numbness, No Cough or SOB.  He has been checking his glucose levels multiple times per day, they have been improving since his previous office visit.  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 Will apply for FMLA and short term disability and instructed to bring paperwork to office to be completed.  The 10-year ASCVD risk score (Arnett DK, et al., 2019) is: 8.3%   Values used to calculate the score:     Age: 78 years     Sex: Male  Is Non-Hispanic African American: No     Diabetic: Yes     Tobacco smoker: No     Systolic Blood Pressure: 619 mmHg     Is BP treated: No     HDL Cholesterol: 39 mg/dL     Total Cholesterol: 147 mg/dL   Assessment & Plan   1. Uncontrolled type 2 diabetes mellitus with hyperglycemia (HCC) Elevated A1C, medication started to improve A1C. Titrating up on trulicity dose in a couple of weeks. 10-year ASCVD risk 8.3%  2. Hypocalcemia Calcium level has returned to normal.   3. Mixed hyperlipidemia Lipid panel severely abnormal during hospitalization. Triglyceride level was >1500. Repeat lipid panel -Lipid Profile  4. History of pancreatitis Reason for recent hospitalization, patient is aware of signs and symptoms and when to go to the ER.    Reason for frequent admissions/ER visits  Hyperglycemia Type 2 diabetes Pancreatitis Increased risk for cardiovascular disease.       Objective:   Vitals:   01/17/21 0916  BP:  102/70  Pulse: 70  Resp: 16  Temp: 98.3 F (36.8 C)  SpO2: 97%  Weight: 192 lb 12.8 oz (87.5 kg)  Height: _0  (1.727 m)    Wt Readings from Last 3 Encounters:  01/17/21 192 lb 12.8 oz (87.5 kg)  01/04/21 192 lb 12.8 oz (87.5 kg)  12/30/20 200 lb (90.7 kg)    Allergies as of 01/17/2021   No Known Allergies      Medication List        Accurate as of January 17, 2021  9:40 AM. If you have any questions, ask your nurse or doctor.          blood glucose meter kit and supplies Kit Dispense based on patient and insurance preference. Use up to four times daily as directed.   Dulaglutide 1.5 MG/0.5ML Sopn Inject 1.5 mg into the skin once a week. Start taking on: January 25, 2021   ibuprofen 200 MG tablet Commonly known as: ADVIL Take 200 mg by mouth every 6 (six) hours as needed.   insulin glargine 100 UNIT/ML Solostar Pen Commonly known as: LANTUS Inject 10 Units into the skin at bedtime.   metFORMIN 500 MG 24 hr tablet Commonly known as: GLUCOPHAGE-XR Take 1 tablet (500 mg total) by mouth in the morning and at bedtime.   naproxen 250 MG tablet Commonly known as: NAPROSYN Take by mouth.   omega-3 acid ethyl esters 1 g capsule Commonly known as: LOVAZA Take 2 capsules (2 g total) by mouth 2 (two) times daily.   Pen Needles 31G X 5 MM Misc 10 Units by Does not apply route at bedtime.   rosuvastatin 10 MG tablet Commonly known as: CRESTOR Take 1 tablet (10 mg total) by mouth daily.         Physical Exam: Constitutional: Patient appears well-developed and well-nourished. Not in obvious distress. HENT: Normocephalic, atraumatic, External right and left ear normal. Oropharynx is clear and moist.  Eyes: Conjunctivae and EOM are normal. PERRLA, no scleral icterus. Neck: Normal ROM. Neck supple. No JVD. No tracheal deviation. No thyromegaly. CVS: RRR, S1/S2 +, no murmurs, no gallops, no carotid bruit.  Pulmonary: Effort and breath sounds normal, no  stridor, rhonchi, wheezes, rales.  Abdominal: Soft. BS +, no distension, tenderness, rebound or guarding.  Musculoskeletal: Normal range of motion. No edema and no tenderness.  Lymphadenopathy: No lymphadenopathy noted, cervical, inguinal or axillary Neuro: Alert. Normal reflexes, muscle tone coordination. No cranial nerve deficit. Skin:  Skin is warm and dry. No rash noted. Not diaphoretic. No erythema. No pallor. Psychiatric: Normal mood and affect. Behavior, judgment, thought content normal.   Data Review   Micro Results No results found for this or any previous visit (from the past 240 hour(s)).   CBC No results for input(s): WBC, HGB, HCT, PLT, MCV, MCH, MCHC, RDW, LYMPHSABS, MONOABS, EOSABS, BASOSABS, BANDABS in the last 168 hours.  Invalid input(s): NEUTRABS, BANDSABD  Chemistries  Recent Labs  Lab 01/15/21 1044  NA 137  K 5.2  CL 97  CO2 23  GLUCOSE 161*  BUN 14  CREATININE 0.87  CALCIUM 9.8   ------------------------------------------------------------------------------------------------------------------ estimated creatinine clearance is 99.5 mL/min (by C-G formula based on SCr of 0.87 mg/dL). ------------------------------------------------------------------------------------------------------------------ No results for input(s): HGBA1C in the last 72 hours. ------------------------------------------------------------------------------------------------------------------ No results for input(s): CHOL, HDL, LDLCALC, TRIG, CHOLHDL, LDLDIRECT in the last 72 hours. ------------------------------------------------------------------------------------------------------------------ Recent Labs    01/15/21 1044  TSH 1.300   ------------------------------------------------------------------------------------------------------------------ No results for input(s): VITAMINB12, FOLATE, FERRITIN, TIBC, IRON, RETICCTPCT in the last 72 hours.  Coagulation profile No results  for input(s): INR, PROTIME in the last 168 hours.  No results for input(s): DDIMER in the last 72 hours.  Cardiac Enzymes No results for input(s): CKMB, TROPONINI, MYOGLOBIN in the last 168 hours.  Invalid input(s): CK ------------------------------------------------------------------------------------------------------------------ Invalid input(s): POCBNP  Return in about 1 month (around 02/16/2021) for F/U, Recheck A1C, Taevion Sikora PCP  cancel other appt in september.   Time Spent in minutes  30 Time spent with patient included reviewing progress notes, labs, imaging studies, and discussing plan for follow up.   This patient was seen by Jonetta Osgood, FNP-C in collaboration with Dr. Clayborn Bigness as a part of collaborative care agreement.  Jonetta Osgood, MSN, FNP-C on 01/17/2021 at 9:40 AM   **Disclaimer: This note may have been dictated with voice recognition software. Similar sounding words can inadvertently be transcribed and this note may contain transcription errors which may not have been corrected upon publication of note.**

## 2021-01-18 LAB — SPECIMEN STATUS REPORT

## 2021-01-18 LAB — LIPID PANEL W/O CHOL/HDL RATIO
Cholesterol, Total: 147 mg/dL (ref 100–199)
HDL: 39 mg/dL — ABNORMAL LOW (ref >39)
LDL Chol Calc (NIH): 68 mg/dL (ref 0–99)
Triglycerides: 244 mg/dL — ABNORMAL HIGH (ref 0–149)
VLDL Cholesterol Cal: 40 mg/dL (ref 5–40)

## 2021-01-23 ENCOUNTER — Telehealth: Payer: Self-pay

## 2021-01-23 NOTE — Telephone Encounter (Signed)
FMLA paperwork given to Alyssa to complete-Toni

## 2021-02-01 ENCOUNTER — Encounter: Payer: Self-pay | Admitting: Nurse Practitioner

## 2021-02-01 ENCOUNTER — Ambulatory Visit (INDEPENDENT_AMBULATORY_CARE_PROVIDER_SITE_OTHER): Payer: BC Managed Care – PPO | Admitting: Nurse Practitioner

## 2021-02-01 ENCOUNTER — Other Ambulatory Visit: Payer: Self-pay

## 2021-02-01 VITALS — BP 105/68 | HR 65 | Temp 98.1°F | Resp 16 | Ht 68.0 in | Wt 192.0 lb

## 2021-02-01 DIAGNOSIS — R300 Dysuria: Secondary | ICD-10-CM | POA: Diagnosis not present

## 2021-02-01 DIAGNOSIS — E1165 Type 2 diabetes mellitus with hyperglycemia: Secondary | ICD-10-CM

## 2021-02-01 DIAGNOSIS — L918 Other hypertrophic disorders of the skin: Secondary | ICD-10-CM | POA: Diagnosis not present

## 2021-02-01 DIAGNOSIS — Z0001 Encounter for general adult medical examination with abnormal findings: Secondary | ICD-10-CM | POA: Diagnosis not present

## 2021-02-01 DIAGNOSIS — Z1211 Encounter for screening for malignant neoplasm of colon: Secondary | ICD-10-CM

## 2021-02-01 DIAGNOSIS — Z1212 Encounter for screening for malignant neoplasm of rectum: Secondary | ICD-10-CM

## 2021-02-01 DIAGNOSIS — Z23 Encounter for immunization: Secondary | ICD-10-CM

## 2021-02-01 MED ORDER — ZOSTER VAC RECOMB ADJUVANTED 50 MCG/0.5ML IM SUSR
0.5000 mL | Freq: Once | INTRAMUSCULAR | 0 refills | Status: AC
Start: 2021-02-01 — End: 2021-02-01

## 2021-02-01 NOTE — Telephone Encounter (Signed)
Notified patient FMLA paperwork faxed to [employer] [phone] and disability paperwork faxed to [employer] [phone]. Sent to be scanned-Toni

## 2021-02-01 NOTE — Progress Notes (Signed)
[facility] [place], [place] [address]  Internal MEDICINE  Office Visit Note  Patient Name: Tyler Powers  [id number]  [id number]  Date of Service:02/01/2021  Chief Complaint  Patient presents with   Annual Exam    Discuss meds   Diabetes   Hyperlipidemia    HPI Tyler Powers presents for an annual well visit and physical exam. he has a history of diabetes and hyperlipidemia. He recently established care at [facility] after a brief hospitalization related to severe elevated glucose levels and then being diagnosed with type 2 diabetes. 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 He will be allowed to return to work once his A1C is lower than 10.0. He had 1 dose of the janssen covid vaccine. He has declined the flu vaccine and tetanus vaccine. He is overdue for colorectal cancer screening. He does not have any family history of colorectal cancer and is requesting the cologard stool test.  He denies any pain, He has no other concerns or questions.      Current Medication: Outpatient Encounter Medications as of 02/01/2021  Medication Sig   blood glucose meter kit and supplies KIT Dispense based on patient and insurance preference. Use up to four times daily as directed.   Dulaglutide 1.5 MG/0.5ML SOPN Inject 1.5 mg into the skin once a week.   ibuprofen (ADVIL) 200 MG tablet Take 200 mg by mouth every 6 (six) hours as needed.   insulin glargine (LANTUS) 100 UNIT/ML Solostar Pen Inject 10 Units into the skin at bedtime.   Insulin Pen Needle (PEN NEEDLES) 31G X 5 MM MISC 10 Units by Does not apply route at bedtime.   naproxen (NAPROSYN) 250 MG tablet Take by mouth.   omega-3 acid ethyl esters (LOVAZA) 1 g capsule Take 2 capsules (2 g total) by mouth 2 (two) times daily.   [EXPIRED] Zoster Vaccine Adjuvanted [facility]) injection Inject 0.5 mLs into the muscle once for  1 dose.   [DISCONTINUED] metFORMIN (GLUCOPHAGE-XR) 500 MG 24 hr tablet Take 1 tablet (500 mg total) by mouth in the morning and at bedtime.   [DISCONTINUED] rosuvastatin (CRESTOR) 10 MG tablet Take 1 tablet (10 mg total) by mouth daily.   No facility-administered encounter medications on file as of 02/01/2021.    Surgical History: Past Surgical History:  Procedure Laterality Date   screws in left leg Left     Medical History: Past Medical History:  Diagnosis Date   Diabetes mellitus without complication ([place])    Hyperlipidemia     Family History: Family History  Problem Relation Age of Onset   Cancer Mother    COPD Father    Asthma Father    Asthma Brother    Diabetes Maternal Aunt    Varicose Veins Maternal Grandmother     Social History   Socioeconomic History   Marital status: Single    Spouse name: Not on file   Number of children: Not on file   Years of education: Not on file   Highest education level: Not on file  Occupational History   Not on file  Tobacco Use   Smoking status: Former    Types: Cigarettes   Smokeless tobacco: Never  Substance and Sexual Activity   Alcohol use: Never   Drug use: Never   Sexual activity: Not on file  Other Topics Concern  Not on file  Social History Narrative   Not on file   Social Determinants of Health   Financial Resource Strain: Not on file  Food Insecurity: Not on file  Transportation Needs: Not on file  Physical Activity: Not on file  Stress: Not on file  Social Connections: Not on file  Intimate Partner Violence: Not on file      Review of Systems  Constitutional:  Negative for activity change, appetite change, chills, fatigue, fever and unexpected weight change.  HENT: Negative.  Negative for congestion, ear pain, rhinorrhea, sore throat and trouble swallowing.   Eyes: Negative.   Respiratory: Negative.  Negative for cough, chest tightness, shortness of breath and wheezing.   Cardiovascular:  Negative.  Negative for chest pain.  Gastrointestinal: Negative.  Negative for abdominal pain, blood in stool, constipation, diarrhea, nausea and vomiting.  Endocrine: Negative.   Genitourinary: Negative.  Negative for difficulty urinating, dysuria, frequency, hematuria and urgency.  Musculoskeletal: Negative.  Negative for arthralgias, back pain, joint swelling, myalgias and neck pain.  Skin: Negative.  Negative for rash and wound.  Allergic/Immunologic: Negative.  Negative for immunocompromised state.  Neurological: Negative.  Negative for dizziness, seizures, numbness and headaches.  Hematological: Negative.   Psychiatric/Behavioral: Negative.  Negative for behavioral problems, self-injury and suicidal ideas. The patient is not nervous/anxious.    Vital Signs: BP 105/68   Pulse 65   Temp 98.1 F (36.7 C)   Resp 16   Ht 5' 8"  (1.727 m)   Wt 192 lb (87.1 kg)   SpO2 97%   BMI 29.19 kg/m    Physical Exam Constitutional:      General: He is awake. He is not in acute distress.    Appearance: Normal appearance. He is well-developed, well-groomed and overweight. He is not ill-appearing or diaphoretic.  HENT:     Head: Normocephalic and atraumatic.     Right Ear: Tympanic membrane, ear canal and external ear normal.     Left Ear: Tympanic membrane, ear canal and external ear normal.     Nose: Nose normal. No congestion or rhinorrhea.     Mouth/Throat:     Lips: Pink.     Mouth: Mucous membranes are moist.     Pharynx: Oropharynx is clear. Uvula midline. No oropharyngeal exudate.  Eyes:     General: Lids are normal. Vision grossly intact. Gaze aligned appropriately. No scleral icterus.       Right eye: No discharge.        Left eye: No discharge.     Extraocular Movements: Extraocular movements intact.     Conjunctiva/sclera: Conjunctivae normal.     Pupils: Pupils are equal, round, and reactive to light.     Funduscopic exam:    Right eye: Red reflex present.        Left eye:  Red reflex present. Neck:     Thyroid: No thyromegaly.     Vascular: No JVD.     Trachea: Trachea and phonation normal. No tracheal deviation.  Cardiovascular:     Rate and Rhythm: Normal rate and regular rhythm.     Pulses: Normal pulses.     Heart sounds: Normal heart sounds, S1 normal and S2 normal. No murmur heard.   No friction rub. No gallop.  Pulmonary:     Effort: Pulmonary effort is normal. No accessory muscle usage or respiratory distress.     Breath sounds: Normal breath sounds and air entry. No stridor. No wheezing or rales.  Chest:  Chest wall: No tenderness.  Abdominal:     General: Bowel sounds are normal. There is no distension.     Palpations: Abdomen is soft. There is no shifting dullness, fluid wave, mass or pulsatile mass.     Tenderness: There is no abdominal tenderness. There is no guarding or rebound.  Musculoskeletal:        General: No tenderness or deformity. Normal range of motion.     Cervical back: Normal range of motion and neck supple.     Right lower leg: No edema.     Left lower leg: No edema.  Lymphadenopathy:     Cervical: No cervical adenopathy.  Skin:    General: Skin is warm and dry.     Capillary Refill: Capillary refill takes less than 2 seconds.     Coloration: Skin is not pale.     Findings: No erythema or rash.  Neurological:     Mental Status: He is alert and oriented to person, place, and time.     Cranial Nerves: No cranial nerve deficit.     Motor: No abnormal muscle tone.     Coordination: Coordination normal.     Gait: Gait normal.     Deep Tendon Reflexes: Reflexes are normal and symmetric.  Psychiatric:        Mood and Affect: Mood and affect normal.        Behavior: Behavior normal. Behavior is cooperative.        Thought Content: Thought content normal.        Judgment: Judgment normal.       Assessment/Plan: 1. Encounter for routine adult health examination with abnormal findings Age-appropriate preventive  screenings and vaccinations discussed, annual physical exam completed. Routine labs done previously and laready discussed. PHM updated.    2. Uncontrolled type 2 diabetes mellitus with hyperglycemia (HCC) Urine microalbumin checked today. His glucose levels are improving, he is increasing to 1.5 mg trulicity this week.  - Urine Microalbumin w/creat. ratio  3. Screening for colorectal cancer Cologard ordered.  - Cologuard  4. Acquired skin tag Large skin tag with small stalk on the right upper arm, he is requesting to have it removed.  - Ambulatory referral to Dermatology  5. Dysuria Routine urinalysis done - UA/M w/rflx Culture, Routine - Microscopic Examination  6. Need for shingles vaccine - Zoster Vaccine Adjuvanted [facility]) injection; Inject 0.5 mLs into the muscle once for 1 dose.  Dispense: 0.5 mL; Refill: 0      General Counseling: Donavon verbalizes understanding of the findings of todays visit and agrees with plan of treatment. I have discussed any further diagnostic evaluation that may be needed or ordered today. We also reviewed his medications today. he has been encouraged to call the office with any questions or concerns that should arise related to todays visit.    Orders Placed This Encounter  Procedures   Microscopic Examination   UA/M w/rflx Culture, Routine   Urine Microalbumin w/creat. ratio   Cologuard   Ambulatory referral to Dermatology    Meds ordered this encounter  Medications   Zoster Vaccine Adjuvanted [facility]) injection    Sig: Inject 0.5 mLs into the muscle once for 1 dose.    Dispense:  0.5 mL    Refill:  0    Return in 13 days (on 02/14/2021) for previously scheduled, F/U, Recheck A1C, Severina Sykora PCP.   Total time spent:30 Minutes Time spent includes review of chart, medications, test results, and follow up plan with  the patient.   [place] Controlled Substance Database was reviewed by me.  This patient was seen by Jonetta Osgood, FNP-C  in collaboration with Dr. Clayborn Bigness as a part of collaborative care agreement.  Janiyla Long R. Valetta Fuller, MSN, FNP-C Internal medicine

## 2021-02-02 LAB — MICROALBUMIN / CREATININE URINE RATIO
Creatinine, Urine: 94.7 mg/dL
Microalb/Creat Ratio: 14 mg/g creat (ref 0–29)
Microalbumin, Urine: 13.5 ug/mL

## 2021-02-02 LAB — UA/M W/RFLX CULTURE, ROUTINE
Bilirubin, UA: NEGATIVE
Glucose, UA: NEGATIVE
Ketones, UA: NEGATIVE
Leukocytes,UA: NEGATIVE
Nitrite, UA: NEGATIVE
Protein,UA: NEGATIVE
RBC, UA: NEGATIVE
Specific Gravity, UA: 1.014 (ref 1.005–1.030)
Urobilinogen, Ur: 1 mg/dL (ref 0.2–1.0)
pH, UA: 6 (ref 5.0–7.5)

## 2021-02-02 LAB — MICROSCOPIC EXAMINATION
Bacteria, UA: NONE SEEN
Casts: NONE SEEN /lpf
Epithelial Cells (non renal): NONE SEEN /hpf (ref 0–10)
RBC, Urine: NONE SEEN /hpf (ref 0–2)
WBC, UA: NONE SEEN /hpf (ref 0–5)

## 2021-02-14 ENCOUNTER — Encounter: Payer: Self-pay | Admitting: Nurse Practitioner

## 2021-02-14 ENCOUNTER — Ambulatory Visit: Payer: BC Managed Care – PPO | Admitting: Nurse Practitioner

## 2021-02-14 ENCOUNTER — Other Ambulatory Visit: Payer: Self-pay

## 2021-02-14 VITALS — BP 108/76 | HR 64 | Temp 98.1°F | Resp 16 | Ht 68.0 in | Wt 190.6 lb

## 2021-02-14 DIAGNOSIS — E1165 Type 2 diabetes mellitus with hyperglycemia: Secondary | ICD-10-CM | POA: Diagnosis not present

## 2021-02-14 DIAGNOSIS — E782 Mixed hyperlipidemia: Secondary | ICD-10-CM

## 2021-02-14 LAB — POCT GLYCOSYLATED HEMOGLOBIN (HGB A1C): Hemoglobin A1C: 9.2 % — AB (ref 4.0–5.6)

## 2021-02-14 MED ORDER — ROSUVASTATIN CALCIUM 10 MG PO TABS: 10.0000 mg | ORAL_TABLET | Freq: Every day | ORAL | 1 refills | Status: DC

## 2021-02-14 MED ORDER — METFORMIN HCL ER 500 MG PO TB24: 500.0000 mg | ORAL_TABLET | Freq: Two times a day (BID) | ORAL | 1 refills | Status: DC

## 2021-02-14 NOTE — Progress Notes (Signed)
[facility] [place], [place] [address]  Internal MEDICINE  Office Visit Note  Patient Name: Tyler Powers  [id number]  [id number]  Date of Service: 02/14/2021  Chief Complaint  Patient presents with   Follow-up   Diabetes   Hyperlipidemia    HPI Tyler Powers presents today for a follow up visit for diabetes. It has not been 3 months yet but Tyler Powers wants to have his A1C checked. He is trying to go back to work. He is unable to work if his A1C is 10.0 or greater. He understands that his insurance may not cover the cost of the A1C lab test if it has been less than 3 months. He verbalizes that he is aware and agrees that he will pay for the test if that is the case. His A1C when he was in the hospital was 12.8.  He was discharged from the hospital on lantus basal insulin and metformin. He was then started on trulicity in early September after establishing with me as his PCP. He has been doing well with no adverse reactions to the medications and he keeps a log book of his glucose levels on a daily basis. He checks his glucose 2-3 times per day.  According to [employer] rules and regulations, he should be allowed to go back to work. A doctor's note was provided to the patient explaining this so he could submit it to his supervisor.  He also needs refills today.    Current Medication: Outpatient Encounter Medications as of 02/14/2021  Medication Sig   blood glucose meter kit and supplies KIT Dispense based on patient and insurance preference. Use up to four times daily as directed.   Dulaglutide 1.5 MG/0.5ML SOPN Inject 1.5 mg into the skin once a week.   ibuprofen (ADVIL) 200 MG tablet Take 200 mg by mouth every 6 (six) hours as needed.   insulin glargine (LANTUS) 100 UNIT/ML Solostar Pen Inject 10 Units into the skin at bedtime.   Insulin Pen Needle (PEN NEEDLES) 31G X 5 MM MISC 10 Units by Does not apply route at bedtime.   naproxen (NAPROSYN) 250 MG tablet Take by mouth.    [DISCONTINUED] metFORMIN (GLUCOPHAGE-XR) 500 MG 24 hr tablet Take 1 tablet (500 mg total) by mouth in the morning and at bedtime.   [DISCONTINUED] omega-3 acid ethyl esters (LOVAZA) 1 g capsule Take 2 capsules (2 g total) by mouth 2 (two) times daily.   [DISCONTINUED] rosuvastatin (CRESTOR) 10 MG tablet Take 1 tablet (10 mg total) by mouth daily.   [DISCONTINUED] metFORMIN (GLUCOPHAGE-XR) 500 MG 24 hr tablet Take 1 tablet (500 mg total) by mouth in the morning and at bedtime.   [DISCONTINUED] rosuvastatin (CRESTOR) 10 MG tablet Take 1 tablet (10 mg total) by mouth daily.   No facility-administered encounter medications on file as of 02/14/2021.    Surgical History: Past Surgical History:  Procedure Laterality Date   screws in left leg Left     Medical History: Past Medical History:  Diagnosis Date   Diabetes mellitus without complication ([name])    Hyperlipidemia     Family History: Family History  Problem Relation Age of Onset   Cancer Mother    COPD Father    Asthma Father    Asthma Brother    Diabetes Maternal Aunt    Varicose Veins Maternal Grandmother     Social History   Socioeconomic History   Marital status: Single    Spouse name: Not on file  Number of children: Not on file   Years of education: Not on file   Highest education level: Not on file  Occupational History   Not on file  Tobacco Use   Smoking status: Former    Types: Cigarettes   Smokeless tobacco: Never  Substance and Sexual Activity   Alcohol use: Never   Drug use: Never   Sexual activity: Not on file  Other Topics Concern   Not on file  Social History Narrative   Not on file   Social Determinants of Health   Financial Resource Strain: Not on file  Food Insecurity: Not on file  Transportation Needs: Not on file  Physical Activity: Not on file  Stress: Not on file  Social Connections: Not on file  Intimate Partner Violence: Not on file      Review of Systems  Constitutional:   Negative for chills, fatigue and unexpected weight change.  HENT:  Negative for congestion, rhinorrhea, sneezing and sore throat.   Eyes:  Negative for redness.  Respiratory:  Negative for cough, chest tightness and shortness of breath.   Cardiovascular:  Negative for chest pain and palpitations.  Gastrointestinal:  Negative for abdominal pain, constipation, diarrhea, nausea and vomiting.  Genitourinary:  Negative for dysuria and frequency.  Musculoskeletal:  Negative for arthralgias, back pain, joint swelling and neck pain.  Skin:  Negative for rash.  Neurological: Negative.  Negative for tremors and numbness.  Hematological:  Negative for adenopathy. Does not bruise/bleed easily.  Psychiatric/Behavioral:  Negative for behavioral problems (Depression), sleep disturbance and suicidal ideas. The patient is not nervous/anxious.    Vital Signs: BP 108/76   Pulse 64   Temp 98.1 F (36.7 C)   Resp 16   Ht _0  (1.727 m)   Wt 190 lb 9.6 oz (86.5 kg)   SpO2 98%   BMI 28.98 kg/m    Physical Exam Vitals reviewed.  Constitutional:      General: He is not in acute distress.    Appearance: Normal appearance. He is normal weight. He is not ill-appearing.  HENT:     Head: Normocephalic and atraumatic.  Eyes:     Extraocular Movements: Extraocular movements intact.     Pupils: Pupils are equal, round, and reactive to light.  Cardiovascular:     Rate and Rhythm: Normal rate and regular rhythm.  Pulmonary:     Effort: Pulmonary effort is normal. No respiratory distress.  Neurological:     Mental Status: He is alert and oriented to person, place, and time.     Cranial Nerves: No cranial nerve deficit.     Coordination: Coordination normal.     Gait: Gait normal.  Psychiatric:        Mood and Affect: Mood normal.        Behavior: Behavior normal.       Assessment/Plan: 1. Uncontrolled type 2 diabetes mellitus with hyperglycemia (HCC) A1C has improved enough for patient to return  to work. Repeat A1C in 3 months. Metformin refill ordered. His A1C today is 9.2.  - POCT HgB A1C  2. Mixed hyperlipidemia Lovaza refill ordered. Will recheck    General Counseling: jonatha gagen understanding of the findings of todays visit and agrees with plan of treatment. I have discussed any further diagnostic evaluation that may be needed or ordered today. We also reviewed his medications today. he has been encouraged to call the office with any questions or concerns that should arise related to todays visit.  Orders Placed This Encounter  Procedures   POCT HgB A1C    Meds ordered this encounter  Medications   DISCONTD: metFORMIN (GLUCOPHAGE-XR) 500 MG 24 hr tablet    Sig: Take 1 tablet (500 mg total) by mouth in the morning and at bedtime.    Dispense:  60 tablet    Refill:  1   DISCONTD: rosuvastatin (CRESTOR) 10 MG tablet    Sig: Take 1 tablet (10 mg total) by mouth daily.    Dispense:  30 tablet    Refill:  1    Return in about 3 months (around 05/17/2021) for F/U, Recheck A1C, Nieves Chapa PCP.   Total time spent:20 Minutes Time spent includes review of chart, medications, test results, and follow up plan with the patient.   [place] Controlled Substance Database was reviewed by me.  This patient was seen by Jonetta Osgood, FNP-C in collaboration with Dr. Clayborn Bigness as a part of collaborative care agreement.   Sravya Grissom R. Valetta Fuller, MSN, FNP-C Internal medicine

## 2021-02-15 ENCOUNTER — Encounter: Payer: Self-pay | Admitting: Nurse Practitioner

## 2021-02-20 ENCOUNTER — Ambulatory Visit: Payer: Self-pay | Admitting: Nurse Practitioner

## 2021-02-26 ENCOUNTER — Other Ambulatory Visit: Payer: Self-pay

## 2021-02-26 ENCOUNTER — Encounter: Payer: Self-pay | Admitting: Nurse Practitioner

## 2021-02-26 MED ORDER — OMEGA-3-ACID ETHYL ESTERS 1 G PO CAPS: 2.0000 g | ORAL_CAPSULE | Freq: Two times a day (BID) | ORAL | 1 refills | Status: DC

## 2021-02-28 ENCOUNTER — Other Ambulatory Visit: Payer: Self-pay

## 2021-02-28 MED ORDER — METFORMIN HCL ER 500 MG PO TB24: 500.0000 mg | ORAL_TABLET | Freq: Two times a day (BID) | ORAL | 1 refills | Status: DC

## 2021-02-28 MED ORDER — ROSUVASTATIN CALCIUM 10 MG PO TABS: 10.0000 mg | ORAL_TABLET | Freq: Every day | ORAL | 1 refills | Status: DC

## 2021-03-26 LAB — COLOGUARD

## 2021-04-30 ENCOUNTER — Encounter: Payer: Self-pay | Admitting: Nurse Practitioner

## 2021-05-15 ENCOUNTER — Encounter: Payer: Self-pay | Admitting: Nurse Practitioner

## 2021-05-15 ENCOUNTER — Other Ambulatory Visit: Payer: Self-pay

## 2021-05-15 ENCOUNTER — Ambulatory Visit: Payer: BC Managed Care – PPO | Admitting: Nurse Practitioner

## 2021-05-15 VITALS — BP 106/72 | HR 68 | Temp 98.5°F | Resp 16 | Ht 68.0 in | Wt 188.8 lb

## 2021-05-15 DIAGNOSIS — E1165 Type 2 diabetes mellitus with hyperglycemia: Secondary | ICD-10-CM

## 2021-05-15 DIAGNOSIS — M159 Polyosteoarthritis, unspecified: Secondary | ICD-10-CM

## 2021-05-15 DIAGNOSIS — Z23 Encounter for immunization: Secondary | ICD-10-CM

## 2021-05-15 LAB — POCT GLYCOSYLATED HEMOGLOBIN (HGB A1C): Hemoglobin A1C: 6.6 % — AB (ref 4.0–5.6)

## 2021-05-15 MED ORDER — MELOXICAM 7.5 MG PO TABS: 7.5000 mg | ORAL_TABLET | Freq: Every day | ORAL | 0 refills | Status: DC

## 2021-05-15 MED ORDER — PNEUMOCOCCAL 20-VAL CONJ VACC 0.5 ML IM SUSY: 0.5000 mL | PREFILLED_SYRINGE | INTRAMUSCULAR | 0 refills | Status: AC

## 2021-05-15 MED ORDER — DULAGLUTIDE 1.5 MG/0.5ML ~~LOC~~ SOAJ: 1.5000 mg | SUBCUTANEOUS | 1 refills | Status: DC

## 2021-05-15 MED ORDER — INSULIN GLARGINE 100 UNIT/ML SOLOSTAR PEN: 10.0000 [IU] | PEN_INJECTOR | Freq: Every day | SUBCUTANEOUS | 3 refills | Status: DC

## 2021-05-15 NOTE — Progress Notes (Signed)
[facility] [place], [place] [address]  Internal MEDICINE  Office Visit Note  Patient Name: Tyler Powers  [id number]  [id number]  Date of Service: 05/15/2021  Chief Complaint  Patient presents with   Follow-up    Discuss meds   Diabetes   Hyperlipidemia    HPI Adrin presents for a follow up visit for diabetes and repeat A1C. At the end of august last year, the patient was hospitalized and his A1C was 12.8. In October, his A1C improved to 9.2. Today his A1C is even more improved at 6.6. His glucose levels have been stable and are usually below 140. He does need refills of medications and is asking for his pneumonia vaccine.       Current Medication: Outpatient Encounter Medications as of 05/15/2021  Medication Sig   blood glucose meter kit and supplies KIT Dispense based on patient and insurance preference. Use up to four times daily as directed.   ibuprofen (ADVIL) 200 MG tablet Take 200 mg by mouth every 6 (six) hours as needed.   Insulin Pen Needle (PEN NEEDLES) 31G X 5 MM MISC 10 Units by Does not apply route at bedtime.   meloxicam (MOBIC) 7.5 MG tablet Take 1 tablet (7.5 mg total) by mouth daily.   metFORMIN (GLUCOPHAGE-XR) 500 MG 24 hr tablet Take 1 tablet (500 mg total) by mouth in the morning and at bedtime.   naproxen (NAPROSYN) 250 MG tablet Take by mouth.   omega-3 acid ethyl esters (LOVAZA) 1 g capsule Take 2 capsules (2 g total) by mouth 2 (two) times daily.   rosuvastatin (CRESTOR) 10 MG tablet Take 1 tablet (10 mg total) by mouth daily.   [DISCONTINUED] Dulaglutide 1.5 MG/0.5ML SOPN Inject 1.5 mg into the skin once a week.   [DISCONTINUED] insulin glargine (LANTUS) 100 UNIT/ML Solostar Pen Inject 10 Units into the skin at bedtime.   [DISCONTINUED] pneumococcal 20-valent conjugate vaccine (PREVNAR 20) 0.5 ML injection Inject 0.5 mLs into the muscle tomorrow at 10 am.   [START ON 07/03/2021] Dulaglutide 1.5 MG/0.5ML SOPN Inject 1.5 mg into the  skin once a week.   insulin glargine (LANTUS) 100 UNIT/ML Solostar Pen Inject 10 Units into the skin at bedtime.   [EXPIRED] pneumococcal 20-valent conjugate vaccine (PREVNAR 20) 0.5 ML injection Inject 0.5 mLs into the muscle tomorrow at 10 am for 1 dose.   No facility-administered encounter medications on file as of 05/15/2021.    Surgical History: Past Surgical History:  Procedure Laterality Date   screws in left leg Left     Medical History: Past Medical History:  Diagnosis Date   Diabetes mellitus without complication ([place])    Hyperlipidemia     Family History: Family History  Problem Relation Age of Onset   Cancer Mother    COPD Father    Asthma Father    Asthma Brother    Diabetes Maternal Aunt    Varicose Veins Maternal Grandmother     Social History   Socioeconomic History   Marital status: Single    Spouse name: Not on file   Number of children: Not on file   Years of education: Not on file   Highest education level: Not on file  Occupational History   Not on file  Tobacco Use   Smoking status: Former    Types: Cigarettes   Smokeless tobacco: Never  Substance and Sexual Activity   Alcohol use: Never   Drug use: Never   Sexual activity: Not  on file  Other Topics Concern   Not on file  Social History Narrative   Not on file   Social Determinants of Health   Financial Resource Strain: Not on file  Food Insecurity: Not on file  Transportation Needs: Not on file  Physical Activity: Not on file  Stress: Not on file  Social Connections: Not on file  Intimate Partner Violence: Not on file      Review of Systems  Constitutional:  Negative for chills, fatigue and unexpected weight change.  HENT:  Negative for congestion, rhinorrhea, sneezing and sore throat.   Eyes:  Negative for redness.  Respiratory:  Negative for cough, chest tightness and shortness of breath.   Cardiovascular:  Negative for chest pain and palpitations.  Gastrointestinal:   Negative for abdominal pain, constipation, diarrhea, nausea and vomiting.  Genitourinary:  Negative for dysuria and frequency.  Musculoskeletal:  Negative for arthralgias, back pain, joint swelling and neck pain.  Skin:  Negative for rash.  Neurological: Negative.  Negative for tremors and numbness.  Hematological:  Negative for adenopathy. Does not bruise/bleed easily.  Psychiatric/Behavioral:  Negative for behavioral problems (Depression), sleep disturbance and suicidal ideas. The patient is not nervous/anxious.    Vital Signs: BP 106/72    Pulse 68    Temp 98.5 F (36.9 C)    Resp 16    Ht _0  (1.727 m)    Wt 188 lb 12.8 oz (85.6 kg)    SpO2 97%    BMI 28.71 kg/m    Physical Exam Vitals reviewed.  Constitutional:      General: He is not in acute distress.    Appearance: Normal appearance. He is normal weight. He is not ill-appearing.  HENT:     Head: Normocephalic and atraumatic.  Eyes:     Pupils: Pupils are equal, round, and reactive to light.  Cardiovascular:     Rate and Rhythm: Normal rate and regular rhythm.  Pulmonary:     Effort: Pulmonary effort is normal. No respiratory distress.  Neurological:     Mental Status: He is alert and oriented to person, place, and time.     Cranial Nerves: No cranial nerve deficit.     Coordination: Coordination normal.     Gait: Gait normal.  Psychiatric:        Mood and Affect: Mood normal.        Behavior: Behavior normal.       Assessment/Plan: 1. Uncontrolled type 2 diabetes mellitus with hyperglycemia (HCC) A1C is significantly improved at 6.6 today. Repeat A1C in 3 months.  - POCT HgB A1C - insulin glargine (LANTUS) 100 UNIT/ML Solostar Pen; Inject 10 Units into the skin at bedtime.  Dispense: 9 mL; Refill: 3 - Dulaglutide 1.5 MG/0.5ML SOPN; Inject 1.5 mg into the skin once a week.  Dispense: 6 mL; Refill: 1  2. Primary osteoarthritis involving multiple joints Will try meloxicam, follow up in 1 month.  - meloxicam  (MOBIC) 7.5 MG tablet; Take 1 tablet (7.5 mg total) by mouth daily.  Dispense: 30 tablet; Refill: 0  3. Need for vaccination - pneumococcal 20-valent conjugate vaccine (PREVNAR 20) 0.5 ML injection; Inject 0.5 mLs into the muscle tomorrow at 10 am for 1 dose.  Dispense: 0.5 mL; Refill: 0   General Counseling: Sunil verbalizes understanding of the findings of todays visit and agrees with plan of treatment. I have discussed any further diagnostic evaluation that may be needed or ordered today. We also reviewed his medications today.  he has been encouraged to call the office with any questions or concerns that should arise related to todays visit.    Orders Placed This Encounter  Procedures   POCT HgB A1C    Meds ordered this encounter  Medications   pneumococcal 20-valent conjugate vaccine (PREVNAR 20) 0.5 ML injection    Sig: Inject 0.5 mLs into the muscle tomorrow at 10 am for 1 dose.    Dispense:  0.5 mL    Refill:  0   insulin glargine (LANTUS) 100 UNIT/ML Solostar Pen    Sig: Inject 10 Units into the skin at bedtime.    Dispense:  9 mL    Refill:  3    Pharmacy requesting 90 day supply, 1 pen lasts 30 days so 3 pens = 3 month supply.   Dulaglutide 1.5 MG/0.5ML SOPN    Sig: Inject 1.5 mg into the skin once a week.    Dispense:  6 mL    Refill:  1   meloxicam (MOBIC) 7.5 MG tablet    Sig: Take 1 tablet (7.5 mg total) by mouth daily.    Dispense:  30 tablet    Refill:  0    Return in about 3 months (around 08/13/2021) for F/U, Recheck A1C, Mishal Probert PCP.   Total time spent:30 Minutes Time spent includes review of chart, medications, test results, and follow up plan with the patient.   [place] Controlled Substance Database was reviewed by me.  This patient was seen by Jonetta Osgood, FNP-C in collaboration with Dr. Clayborn Bigness as a part of collaborative care agreement.   Jerrine Urschel R. Valetta Fuller, MSN, FNP-C Internal medicine

## 2021-06-07 LAB — COLOGUARD: COLOGUARD: NEGATIVE

## 2021-06-10 NOTE — Progress Notes (Signed)
Please call patient and let him know that his cologard test was negative. Repeat in 3 years.

## 2021-06-11 ENCOUNTER — Telehealth: Payer: Self-pay

## 2021-06-11 NOTE — Telephone Encounter (Signed)
-----   Message from Jonetta Osgood, NP sent at 06/10/2021  4:47 PM EST ----- Please call patient and let him know that his cologard test was negative. Repeat in 3 years.

## 2021-06-19 ENCOUNTER — Other Ambulatory Visit: Payer: Self-pay | Admitting: Nurse Practitioner

## 2021-06-19 ENCOUNTER — Encounter: Payer: Self-pay | Admitting: Nurse Practitioner

## 2021-06-25 ENCOUNTER — Encounter: Payer: Self-pay | Admitting: Nurse Practitioner

## 2021-06-25 ENCOUNTER — Other Ambulatory Visit: Payer: Self-pay | Admitting: Nurse Practitioner

## 2021-06-25 DIAGNOSIS — M159 Polyosteoarthritis, unspecified: Secondary | ICD-10-CM

## 2021-06-25 MED ORDER — MELOXICAM 7.5 MG PO TABS: 7.5000 mg | ORAL_TABLET | Freq: Every day | ORAL | 3 refills | Status: DC

## 2021-07-10 ENCOUNTER — Other Ambulatory Visit: Payer: Self-pay

## 2021-07-10 ENCOUNTER — Encounter: Payer: Self-pay | Admitting: Nurse Practitioner

## 2021-07-10 MED ORDER — PEN NEEDLES 31G X 5 MM MISC: 10.0000 [IU] | Freq: Every day | 3 refills | Status: DC

## 2021-07-24 ENCOUNTER — Ambulatory Visit: Payer: BC Managed Care – PPO | Admitting: Dermatology

## 2021-07-24 ENCOUNTER — Other Ambulatory Visit: Payer: Self-pay

## 2021-07-24 ENCOUNTER — Encounter: Payer: Self-pay | Admitting: Nurse Practitioner

## 2021-07-24 ENCOUNTER — Other Ambulatory Visit: Payer: Self-pay | Admitting: Dermatology

## 2021-07-24 DIAGNOSIS — M159 Polyosteoarthritis, unspecified: Secondary | ICD-10-CM

## 2021-07-24 DIAGNOSIS — D489 Neoplasm of uncertain behavior, unspecified: Secondary | ICD-10-CM

## 2021-07-24 DIAGNOSIS — D485 Neoplasm of uncertain behavior of skin: Secondary | ICD-10-CM | POA: Diagnosis not present

## 2021-07-24 DIAGNOSIS — L219 Seborrheic dermatitis, unspecified: Secondary | ICD-10-CM

## 2021-07-24 MED ORDER — MELOXICAM 7.5 MG PO TABS: 7.5000 mg | ORAL_TABLET | Freq: Every day | ORAL | 0 refills | Status: DC

## 2021-07-24 MED ORDER — KETOCONAZOLE 2 % EX CREA
TOPICAL_CREAM | CUTANEOUS | 6 refills | Status: DC
Start: 2021-07-24 — End: 2021-11-13

## 2021-07-24 MED ORDER — HYDROCORTISONE 2.5 % EX CREA
TOPICAL_CREAM | CUTANEOUS | 6 refills | Status: DC
Start: 2021-07-24 — End: 2021-11-13

## 2021-07-24 MED ORDER — KETOCONAZOLE 2 % EX SHAM: MEDICATED_SHAMPOO | CUTANEOUS | 6 refills | Status: DC

## 2021-07-24 NOTE — Progress Notes (Signed)
? ?New Patient Visit ? ?Subjective  ?Tyler Powers is a 61 y.o. male who presents for the following: New Patient (Initial Visit) (Patient here today concerning a skin tag at right arm. He denies any pain or discomfort. Patient reports some dryness and peeling at face. Patient reports no personal history of skin cancer. ). ?The patient has spots, moles and lesions to be evaluated, some may be new or changing and the patient has concerns that these could be cancer. ? ?The following portions of the chart were reviewed this encounter and updated as appropriate:  ? Tobacco  Allergies  Meds  Problems  Med Hx  Surg Hx  Fam Hx   ?  ?Review of Systems:  No other skin or systemic complaints except as noted in HPI or Assessment and Plan. ? ?Objective  ?Well appearing patient in no apparent distress; mood and affect are within normal limits. ? ?A focused examination was performed including face, scalp, ears, left arm . Relevant physical exam findings are noted in the Assessment and Plan. ? ?Head - Anterior (Face) ?Pink patches with greasy scale at face, scalp, and ears  ? ? ? ? ? ?right proximal bicep ? 2.5 cm flesh colored nodule  ? ? ? ? ? ?Assessment & Plan  ?Seborrheic dermatitis -severe ?Face and scalp ? ?Seborrheic Dermatitis  ?-  is a chronic persistent rash characterized by pinkness and scaling most commonly of the mid face but also can occur on the scalp (dandruff), ears; mid chest, mid back and groin.  It tends to be exacerbated by stress and cooler weather.  People who have neurologic disease may experience new onset or exacerbation of existing seborrheic dermatitis.  The condition is not curable but treatable and can be controlled. ? ?Treatable but not curable  ? ?Start Ketoconazole 2 % shampoo - use as a body wash to face, scalp and ears 3 days weekly, leave for a few minutes and rinse.  ? ?Start Ketoconazole 2 % cream - apply topically to affected areas of face, scalp, and ears 3 times weekly at bedtime on  Monday Wednesday and Friday.  ? ?Start Hydrocortisone 2.5 % cream - apply topically to affected areas of face, scalp and ears 3 times weekly at bedtime on Tuesday Thursday and Saturday.  ? ?Topical steroids (such as triamcinolone, fluocinolone, fluocinonide, mometasone, clobetasol, halobetasol, betamethasone, hydrocortisone) can cause thinning and lightening of the skin if they are used for too long in the same area. Your physician has selected the right strength medicine for your problem and area affected on the body. Please use your medication only as directed by your physician to prevent side effects.  ? ?ketoconazole (NIZORAL) 2 % shampoo - Head - Anterior (Face) ?apply three times per week, use as a body wash at scalp, face, ears, massage onto affected areas and leave for a few minutes if can, then rinse ?ketoconazole (NIZORAL) 2 % cream - Head - Anterior (Face) ?Apply topically to affected areas of face, scalp, and ears at 3 times weekly at bedtime on Monday Wed and Friday ?hydrocortisone 2.5 % cream - Head - Anterior (Face) ?Apply topically to affected areas of face, scalp and ears 3 times weekly at bedtime. Tuesday Thursday Saturday ? ?Neoplasm of uncertain behavior ?right proximal bicep ?Skin excision ? ?Total excision diameter (cm):  2.5 ?Informed consent: discussed and consent obtained   ?Timeout: patient name, date of birth, surgical site, and procedure verified   ?Procedure prep:  Patient was prepped and draped in  usual sterile fashion ?Prep type:  Isopropyl alcohol and povidone-iodine ?Anesthesia: the lesion was anesthetized in a standard fashion   ?Anesthetic:  1% lidocaine w/ epinephrine 1-100,000 buffered w/ 8.4% NaHCO3 ?Instrument used: #15 blade   ?Hemostasis achieved with: pressure   ?Hemostasis achieved with comment:  Electrocautery ?Outcome: patient tolerated procedure well with no complications   ?Post-procedure details: sterile dressing applied and wound care instructions given   ?Dressing  type: bandage and pressure dressing (Mupirocin)   ? ?Specimen 1 - Surgical pathology ?Differential Diagnosis: r/o fibrolipoma vs other ?Check Margins: No ?R/o fibroma vs skin tag vs other  ? ?Return for 3 month follow up seb derm . ? ?I, Ruthell Rummage, CMA, am acting as scribe for Sarina Ser, MD. ?Documentation: I have reviewed the above documentation for accuracy and completeness, and I agree with the above. ? ?Sarina Ser, MD ? ? ?

## 2021-07-24 NOTE — Patient Instructions (Addendum)
Use ketoconazole shampoo at face, scalp, and ear 3 times weekly . Use as a wash to affected areas massage onto skin, let sit for a few minutes, then rinse. ? ?Use Ketoconazole cream - apply to affected areas of face , scalp and ears  3 times weekly at bedtime (Monday wed Friday.   ? ?Use Hydrocortisone 2.5 % cream apply to affected areas of scalp, face and ears 3 times weekly at bedtime (Tuesday Thursday and Saturday)  ? ?Topical steroids (such as triamcinolone, fluocinolone, fluocinonide, mometasone, clobetasol, halobetasol, betamethasone, hydrocortisone) can cause thinning and lightening of the skin if they are used for too long in the same area. Your physician has selected the right strength medicine for your problem and area affected on the body. Please use your medication only as directed by your physician to prevent side effects.  ? ? ? ? ? ? ? ? ? ?Biopsy Wound Care Instructions ? ?Leave the original bandage on for 24 hours if possible.  If the bandage becomes soaked or soiled before that time, it is OK to remove it and examine the wound.  A small amount of post-operative bleeding is normal.  If excessive bleeding occurs, remove the bandage, place gauze over the site and apply continuous pressure (no peeking) over the area for 30 minutes. If this does not work, please call our clinic as soon as possible or page your doctor if it is after hours.  ? ?Once a day, cleanse the wound with soap and water. It is fine to shower. If a thick crust develops you may use a Q-tip dipped into dilute hydrogen peroxide (mix 1:1 with water) to dissolve it.  Hydrogen peroxide can slow the healing process, so use it only as needed.   ? ?After washing, apply petroleum jelly (Vaseline) or an antibiotic ointment if your doctor prescribed one for you, followed by a bandage.   ? ?For best healing, the wound should be covered with a layer of ointment at all times. If you are not able to keep the area covered with a bandage to hold the  ointment in place, this may mean re-applying the ointment several times a day.  Continue this wound care until the wound has healed and is no longer open.  ? ?Itching and mild discomfort is normal during the healing process. However, if you develop pain or severe itching, please call our office.  ? ?If you have any discomfort, you can take Tylenol (acetaminophen) or ibuprofen as directed on the bottle. (Please do not take these if you have an allergy to them or cannot take them for another reason). ? ?Some redness, tenderness and white or yellow material in the wound is normal healing.  If the area becomes very sore and red, or develops a thick yellow-green material (pus), it may be infected; please notify us.   ? ?If you have stitches, return to clinic as directed to have the stitches removed. You will continue wound care for 2-3 days after the stitches are removed.  ? ?Wound healing continues for up to one year following surgery. It is not unusual to experience pain in the scar from time to time during the interval.  If the pain becomes severe or the scar thickens, you should notify the office.   ? ?A slight amount of redness in a scar is expected for the first six months.  After six months, the redness will fade and the scar will soften and fade.  The color difference becomes less  noticeable with time.  If there are any problems, return for a post-op surgery check at your earliest convenience. ? ?To improve the appearance of the scar, you can use silicone scar gel, cream, or sheets (such as Mederma or Serica) every night for up to one year. These are available over the counter (without a prescription). ? ?Please call our office at [phone] for any questions or concerns. ? ? ? ? ?If You Need Anything After Your Visit ? ?If you have any questions or concerns for your doctor, please call our main line at [phone] and press option 4 to reach your doctor's medical assistant. If no one answers, please leave a  voicemail as directed and we will return your call as soon as possible. Messages left after 4 pm will be answered the following business day.  ? ?You may also send [place] a message via MyChart. We typically respond to MyChart messages within 1-2 business days. ? ?For prescription refills, please ask your pharmacy to contact our office. Our fax number is [phone]. ? ?If you have an urgent issue when the clinic is closed that cannot wait until the next business day, you can page your doctor at the number below.   ? ?Please note that while we do our best to be available for urgent issues outside of office hours, we are not available 24/7.  ? ?If you have an urgent issue and are unable to reach [place], you may choose to seek medical care at your doctor's office, retail clinic, urgent care center, or emergency room. ? ?If you have a medical emergency, please immediately call 911 or go to the emergency department. ? ?Pager Numbers ? ?- Dr. Nehemiah Massed: [phone] ? ?- Dr. Laurence Ferrari: [phone] ? ?- Dr. Nicole Kindred: [phone] ? ?In the event of inclement weather, please call our main line at [phone] for an update on the status of any delays or closures. ? ?Dermatology Medication Tips: ?Please keep the boxes that topical medications come in in order to help keep track of the instructions about where and how to use these. Pharmacies typically print the medication instructions only on the boxes and not directly on the medication tubes.  ? ?If your medication is too expensive, please contact our office at [phone] option 4 or send [place] a message through [place].  ? ?We are unable to tell what your co-pay for medications will be in advance as this is different depending on your insurance coverage. However, we may be able to find a substitute medication at lower cost or fill out paperwork to get insurance to cover a needed medication.  ? ?If a prior authorization is required to get your medication covered by your insurance  company, please allow [place] 1-2 business days to complete this process. ? ?Drug prices often vary depending on where the prescription is filled and some pharmacies may offer cheaper prices. ? ?The website www.goodrx.com contains coupons for medications through different pharmacies. The prices here do not account for what the cost may be with help from insurance (it may be cheaper with your insurance), but the website can give you the price if you did not use any insurance.  ?- You can print the associated coupon and take it with your prescription to the pharmacy.  ?- You may also stop by our office during regular business hours and pick up a GoodRx coupon card.  ?- If you need your prescription sent electronically to a different pharmacy, notify our office through [facility] or  by phone at [phone] option 4. ? ? ? ? ?Si Usted Necesita Algo Despu?s de Su Visita ? ?Tambi?n puede enviarnos un mensaje a trav?s de MyChart. Por lo general respondemos a los mensajes de MyChart en el transcurso de 1 a 2 d?as h?biles. ? ?Para renovar recetas, por favor pida a su farmacia que se ponga en contacto con nuestra oficina. Nuestro n?mero de fax es el [phone]. ? ?Si tiene un asunto urgente cuando la cl?nica est? cerrada y que no puede esperar hasta el siguiente d?a h?bil, puede llamar/localizar a su doctor(a) al n?mero que aparece a continuaci?n.  ? ?Por favor, tenga en cuenta que aunque hacemos todo lo posible para estar disponibles para asuntos urgentes fuera del horario de oficina, no estamos disponibles las 24 horas del d?a, los 7 d?as de la semana.  ? ?Si tiene un problema urgente y no puede comunicarse con nosotros, puede optar por buscar atenci?n m?dica  en el consultorio de su doctor(a), en una cl?nica privada, en un centro de atenci?n urgente o en una sala de emergencias. ? ?Si tiene Engineer, maintenance (IT) m?dica, por favor llame inmediatamente al 911 o vaya a la sala de emergencias. ? ?N?meros de b?per ? ?- Dr.  Nehemiah Massed: [phone] ? ?- Dra. Moye: [phone] ? ?- Dra. Nicole Kindred: [phone] ? ?En caso de inclemencias del tiempo, por favor llame a nuestra l?nea principal al [phone] para una actualizaci?n sobre e

## 2021-07-28 ENCOUNTER — Encounter: Payer: Self-pay | Admitting: Dermatology

## 2021-07-29 ENCOUNTER — Telehealth: Payer: Self-pay

## 2021-07-29 NOTE — Telephone Encounter (Signed)
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

## 2021-07-29 NOTE — Telephone Encounter (Signed)
Advised patient of results/hd  

## 2021-08-12 ENCOUNTER — Other Ambulatory Visit: Payer: Self-pay

## 2021-08-12 MED ORDER — ROSUVASTATIN CALCIUM 10 MG PO TABS: 10.0000 mg | ORAL_TABLET | Freq: Every day | ORAL | 1 refills | Status: DC

## 2021-08-16 ENCOUNTER — Encounter: Payer: Self-pay | Admitting: Nurse Practitioner

## 2021-08-16 ENCOUNTER — Ambulatory Visit: Payer: BC Managed Care – PPO | Admitting: Nurse Practitioner

## 2021-08-16 VITALS — BP 108/78 | HR 70 | Temp 98.4°F | Resp 16 | Ht 68.0 in | Wt 193.4 lb

## 2021-08-16 DIAGNOSIS — E782 Mixed hyperlipidemia: Secondary | ICD-10-CM

## 2021-08-16 DIAGNOSIS — M159 Polyosteoarthritis, unspecified: Secondary | ICD-10-CM

## 2021-08-16 DIAGNOSIS — E1165 Type 2 diabetes mellitus with hyperglycemia: Secondary | ICD-10-CM

## 2021-08-16 LAB — POCT GLYCOSYLATED HEMOGLOBIN (HGB A1C): Hemoglobin A1C: 6.3 % — AB (ref 4.0–5.6)

## 2021-08-16 NOTE — Progress Notes (Signed)
[facility] ?[address] ?[place], [place] [address] ? ?Internal MEDICINE  ?Office Visit Note ? ?Patient Name: Tyler Powers ? [id number]  ?[id number] ? ?Date of Service: 08/16/2021 ? ?Chief Complaint  ?Patient presents with  ? Follow-up  ? Diabetes  ? Hyperlipidemia  ? ? ?HPI ?Tyler Powers presents for follow-up visit for diabetes and hyperlipidemia.  His A1c continues to improve to 6.3 today.  His A1c in January was 6.6.  He has had no changes to his medications or his diet since last office visit. ?He does not need any refills today.  His blood pressure and other vital signs remained within normal limits. ?He has gained 5lbs since his previous office visit, BMI is 29.41.  ? ? ?Current Medication: ?Outpatient Encounter Medications as of 08/16/2021  ?Medication Sig  ? blood glucose meter kit and supplies KIT Dispense based on patient and insurance preference. Use up to four times daily as directed.  ? Dulaglutide 1.5 MG/0.5ML SOPN Inject 1.5 mg into the skin once a week.  ? hydrocortisone 2.5 % cream Apply topically to affected areas of face, scalp and ears 3 times weekly at bedtime. Tuesday Thursday Saturday  ? ibuprofen (ADVIL) 200 MG tablet Take 200 mg by mouth every 6 (six) hours as needed.  ? insulin glargine (LANTUS) 100 UNIT/ML Solostar Pen Inject 10 Units into the skin at bedtime.  ? Insulin Pen Needle (PEN NEEDLES) 31G X 5 MM MISC 10 Units by Does not apply route at bedtime.  ? ketoconazole (NIZORAL) 2 % cream Apply topically to affected areas of face, scalp, and ears at 3 times weekly at bedtime on Monday Wed and Friday  ? ketoconazole (NIZORAL) 2 % shampoo apply three times per week, use as a body wash at scalp, face, ears, massage onto affected areas and leave for a few minutes if can, then rinse.  ? meloxicam (MOBIC) 7.5 MG tablet Take 1 tablet (7.5 mg total) by mouth daily.  ? metFORMIN (GLUCOPHAGE-XR) 500 MG 24 hr tablet Take 1 tablet (500 mg total) by mouth in the morning and at bedtime.  ? naproxen  (NAPROSYN) 250 MG tablet Take by mouth.  ? omega-3 acid ethyl esters (LOVAZA) 1 g capsule Take 2 capsules (2 g total) by mouth 2 (two) times daily.  ? rosuvastatin (CRESTOR) 10 MG tablet Take 1 tablet (10 mg total) by mouth daily.  ? ?No facility-administered encounter medications on file as of 08/16/2021.  ? ? ?Surgical History: ?Past Surgical History:  ?Procedure Laterality Date  ? screws in left leg Left   ? ? ?Medical History: ?Past Medical History:  ?Diagnosis Date  ? Diabetes mellitus without complication ([place])   ? Hyperlipidemia   ? ? ?Family History: ?Family History  ?Problem Relation Age of Onset  ? Cancer Mother   ? COPD Father   ? Asthma Father   ? Asthma Brother   ? Diabetes Maternal Aunt   ? Varicose Veins Maternal Grandmother   ? ? ?Social History  ? ?Socioeconomic History  ? Marital status: Single  ?  Spouse name: Not on file  ? Number of children: Not on file  ? Years of education: Not on file  ? Highest education level: Not on file  ?Occupational History  ? Not on file  ?Tobacco Use  ? Smoking status: Former  ?  Types: Cigarettes  ? Smokeless tobacco: Never  ?Substance and Sexual Activity  ? Alcohol use: Never  ? Drug use: Never  ? Sexual activity: Not on  file  ?Other Topics Concern  ? Not on file  ?Social History Narrative  ? Not on file  ? ?Social Determinants of Health  ? ?Financial Resource Strain: Not on file  ?Food Insecurity: Not on file  ?Transportation Needs: Not on file  ?Physical Activity: Not on file  ?Stress: Not on file  ?Social Connections: Not on file  ?Intimate Partner Violence: Not on file  ? ? ? ? ?Review of Systems  ?Constitutional:  Negative for chills, fatigue and unexpected weight change.  ?HENT:  Negative for congestion, rhinorrhea, sneezing and sore throat.   ?Eyes:  Negative for redness.  ?Respiratory:  Negative for cough, chest tightness and shortness of breath.   ?Cardiovascular:  Negative for chest pain and palpitations.  ?Gastrointestinal:  Negative for abdominal pain,  constipation, diarrhea, nausea and vomiting.  ?Genitourinary:  Negative for dysuria and frequency.  ?Musculoskeletal:  Negative for arthralgias, back pain, joint swelling and neck pain.  ?Skin:  Negative for rash.  ?Neurological: Negative.  Negative for tremors and numbness.  ?Hematological:  Negative for adenopathy. Does not bruise/bleed easily.  ?Psychiatric/Behavioral:  Negative for behavioral problems (Depression), sleep disturbance and suicidal ideas. The patient is not nervous/anxious.   ? ?Vital Signs: ?BP 108/78   Pulse 70   Temp 98.4 ?F (36.9 ?C)   Resp 16   Ht 5' 8"  (1.727 m)   Wt 193 lb 6.4 oz (87.7 kg)   SpO2 98%   BMI 29.41 kg/m?  ? ? ?Physical Exam ?Vitals reviewed.  ?Constitutional:   ?   General: He is not in acute distress. ?   Appearance: Normal appearance. He is obese. He is not ill-appearing.  ?HENT:  ?   Head: Normocephalic and atraumatic.  ?Eyes:  ?   Pupils: Pupils are equal, round, and reactive to light.  ?Cardiovascular:  ?   Rate and Rhythm: Normal rate and regular rhythm.  ?Pulmonary:  ?   Effort: Pulmonary effort is normal. No respiratory distress.  ?Neurological:  ?   Mental Status: He is alert and oriented to person, place, and time.  ?Psychiatric:     ?   Mood and Affect: Mood normal.     ?   Behavior: Behavior normal.  ? ? ? ? ? ?Assessment/Plan: ?1. Uncontrolled type 2 diabetes mellitus with hyperglycemia ([place]) ?A1C continues to improve, will repeat A1C in 3 months. If his A1C continues to improve, will consider decreasing lantus insulin or metformin dose.  ?- POCT HgB A1C ? ?2. Mixed hyperlipidemia ?Stable, continue medication, taking rosuvastatin.  ? ?3. Primary osteoarthritis involving multiple joints ?Continue meloxicam as prescribed.  ? ? ?General Counseling: Tyler Powers understanding of the findings of todays visit and agrees with plan of treatment. I have discussed any further diagnostic evaluation that may be needed or ordered today. We also reviewed his  medications today. he has been encouraged to call the office with any questions or concerns that should arise related to todays visit. ? ? ? ?Orders Placed This Encounter  ?Procedures  ? POCT HgB A1C  ? ? ?No orders of the defined types were placed in this encounter. ? ? ?Return in about 3 months (around 11/15/2021) for F/U, Recheck A1C, Tyler Powers PCP. ? ? ?Total time spent:30 Minutes ?Time spent includes review of chart, medications, test results, and follow up plan with the patient.  ? ?[place] Controlled Substance Database was reviewed by me. ? ?This patient was seen by Jonetta Osgood, FNP-C in collaboration with Dr. Clayborn Bigness as a  part of collaborative care agreement. ? ? ?Alayne Estrella R. Valetta Fuller, MSN, FNP-C ?Internal medicine  ?

## 2021-08-17 ENCOUNTER — Encounter: Payer: Self-pay | Admitting: Nurse Practitioner

## 2021-08-18 ENCOUNTER — Other Ambulatory Visit: Payer: Self-pay | Admitting: Nurse Practitioner

## 2021-09-03 ENCOUNTER — Encounter: Payer: Self-pay | Admitting: Nurse Practitioner

## 2021-09-03 ENCOUNTER — Other Ambulatory Visit: Payer: Self-pay

## 2021-09-03 MED ORDER — METFORMIN HCL ER 500 MG PO TB24: 500.0000 mg | ORAL_TABLET | Freq: Two times a day (BID) | ORAL | 1 refills | Status: DC

## 2021-11-08 ENCOUNTER — Encounter: Payer: Self-pay | Admitting: Nurse Practitioner

## 2021-11-08 ENCOUNTER — Ambulatory Visit: Payer: BC Managed Care – PPO | Admitting: Nurse Practitioner

## 2021-11-08 VITALS — BP 109/68 | HR 62 | Temp 98.4°F | Resp 16 | Ht 68.0 in | Wt 198.0 lb

## 2021-11-08 DIAGNOSIS — E782 Mixed hyperlipidemia: Secondary | ICD-10-CM

## 2021-11-08 DIAGNOSIS — E1165 Type 2 diabetes mellitus with hyperglycemia: Secondary | ICD-10-CM

## 2021-11-08 DIAGNOSIS — E6609 Other obesity due to excess calories: Secondary | ICD-10-CM | POA: Diagnosis not present

## 2021-11-08 DIAGNOSIS — Z6830 Body mass index (BMI) 30.0-30.9, adult: Secondary | ICD-10-CM

## 2021-11-08 DIAGNOSIS — M159 Polyosteoarthritis, unspecified: Secondary | ICD-10-CM

## 2021-11-08 LAB — POCT GLYCOSYLATED HEMOGLOBIN (HGB A1C): Hemoglobin A1C: 6.5 % — AB (ref 4.0–5.6)

## 2021-11-08 NOTE — Progress Notes (Signed)
[facility] [place], [place] [address]  Internal MEDICINE  Office Visit Note  Patient Name: Tyler Powers  [id number]  [id number]  Date of Service: 11/08/2021  Chief Complaint  Patient presents with   Follow-up   Diabetes   Hyperlipidemia    HPI Tyler Powers presents for a follow-up visit for diabetes and hyperlipidemia.  His A1c continues to increased to 6.5 today. His A1c in April was 6.3.  He has had no changes to his medications or his diet since last office visit. He does not need any refills today.  His blood pressure and other vital signs remained within normal limits. He has gained 5 more lbs since his previous office visit, BMI is 30.11.   Current Medication: Outpatient Encounter Medications as of 11/08/2021  Medication Sig   blood glucose meter kit and supplies KIT Dispense based on patient and insurance preference. Use up to four times daily as directed.   Dulaglutide 1.5 MG/0.5ML SOPN Inject 1.5 mg into the skin once a week.   hydrocortisone 2.5 % cream Apply topically to affected areas of face, scalp and ears 3 times weekly at bedtime. Tuesday Thursday Saturday   ibuprofen (ADVIL) 200 MG tablet Take 200 mg by mouth every 6 (six) hours as needed.   insulin glargine (LANTUS) 100 UNIT/ML Solostar Pen Inject 10 Units into the skin at bedtime.   Insulin Pen Needle (PEN NEEDLES) 31G X 5 MM MISC 10 Units by Does not apply route at bedtime.   ketoconazole (NIZORAL) 2 % cream Apply topically to affected areas of face, scalp, and ears at 3 times weekly at bedtime on Monday Wed and Friday   ketoconazole (NIZORAL) 2 % shampoo apply three times per week, use as a body wash at scalp, face, ears, massage onto affected areas and leave for a few minutes if can, then rinse.   meloxicam (MOBIC) 7.5 MG tablet Take 1 tablet (7.5 mg total) by mouth daily.   metFORMIN (GLUCOPHAGE-XR) 500 MG 24 hr tablet Take 1 tablet (500 mg total) by mouth in the morning and at bedtime.    naproxen (NAPROSYN) 250 MG tablet Take by mouth.   omega-3 acid ethyl esters (LOVAZA) 1 g capsule TAKE 2 CAPSULES BY MOUTH 2 TIMES DAILY.   rosuvastatin (CRESTOR) 10 MG tablet Take 1 tablet (10 mg total) by mouth daily.   No facility-administered encounter medications on file as of 11/08/2021.    Surgical History: Past Surgical History:  Procedure Laterality Date   screws in left leg Left     Medical History: Past Medical History:  Diagnosis Date   Diabetes mellitus without complication ([place])    Hyperlipidemia     Family History: Family History  Problem Relation Age of Onset   Cancer Mother    COPD Father    Asthma Father    Asthma Brother    Diabetes Maternal Aunt    Varicose Veins Maternal Grandmother     Social History   Socioeconomic History   Marital status: Single    Spouse name: Not on file   Number of children: Not on file   Years of education: Not on file   Highest education level: Not on file  Occupational History   Not on file  Tobacco Use   Smoking status: Former    Types: Cigarettes   Smokeless tobacco: Never  Substance and Sexual Activity   Alcohol use: Never   Drug use: Never   Sexual activity: Not on file  Other  Topics Concern   Not on file  Social History Narrative   Not on file   Social Determinants of Health   Financial Resource Strain: Not on file  Food Insecurity: Not on file  Transportation Needs: Not on file  Physical Activity: Not on file  Stress: Not on file  Social Connections: Not on file  Intimate Partner Violence: Not on file      Review of Systems  Constitutional:  Negative for chills, fatigue and unexpected weight change.  HENT:  Negative for congestion, rhinorrhea, sneezing and sore throat.   Eyes:  Negative for redness.  Respiratory:  Negative for cough, chest tightness and shortness of breath.   Cardiovascular:  Negative for chest pain and palpitations.  Gastrointestinal:  Negative for abdominal pain,  constipation, diarrhea, nausea and vomiting.  Genitourinary:  Negative for dysuria and frequency.  Musculoskeletal:  Negative for arthralgias, back pain, joint swelling and neck pain.  Skin:  Negative for rash.  Neurological: Negative.  Negative for tremors and numbness.  Hematological:  Negative for adenopathy. Does not bruise/bleed easily.  Psychiatric/Behavioral:  Negative for behavioral problems (Depression), sleep disturbance and suicidal ideas. The patient is not nervous/anxious.     Vital Signs: BP 109/68   Pulse 62   Temp 98.4 F (36.9 C)   Resp 16   Ht 5' 8"  (1.727 m)   Wt 198 lb (89.8 kg)   SpO2 97%   BMI 30.11 kg/m    Physical Exam Vitals reviewed.  Constitutional:      General: He is not in acute distress.    Appearance: Normal appearance. He is obese. He is not ill-appearing.  HENT:     Head: Normocephalic and atraumatic.  Eyes:     Pupils: Pupils are equal, round, and reactive to light.  Cardiovascular:     Rate and Rhythm: Normal rate and regular rhythm.  Pulmonary:     Effort: Pulmonary effort is normal. No respiratory distress.  Neurological:     Mental Status: He is alert and oriented to person, place, and time.  Psychiatric:        Mood and Affect: Mood normal.        Behavior: Behavior normal.        Assessment/Plan: 1. Uncontrolled type 2 diabetes mellitus with hyperglycemia ([facility]) A1C did increase slightly to 6.5, will repeat A1C in 3 months. Patient will continue to work on diet modifications and increase physical activity as tolerated.  - POCT HgB A1C   2. Mixed hyperlipidemia Stable, continue medication, taking rosuvastatin.    3. Primary osteoarthritis involving multiple joints Continue meloxicam as prescribed. will probably increase dose to 15 mg with next refill.   4. Class 1 obesity due to excess calories with serious comorbidity and body mass index (BMI) of 30.0 to 30.9 in adult Has gained 10 lbs over the past 6 months. Working on  diet and lifestyle modifications.    General Counseling: Tyler Powers understanding of the findings of todays visit and agrees with plan of treatment. I have discussed any further diagnostic evaluation that may be needed or ordered today. We also reviewed his medications today. he has been encouraged to call the office with any questions or concerns that should arise related to todays visit.    Orders Placed This Encounter  Procedures   POCT HgB A1C    No orders of the defined types were placed in this encounter.   Return in about 3 months (around 02/08/2022) for previously scheduled, CPE, Nelsy Madonna  PCP in sept, needs to reschedule for a friday. .   Total time spent:30 Minutes Time spent includes review of chart, medications, test results, and follow up plan with the patient.   [place] Controlled Substance Database was reviewed by me.  This patient was seen by Jonetta Osgood, FNP-C in collaboration with Dr. Clayborn Bigness as a part of collaborative care agreement.   Abbigaile Rockman R. Valetta Fuller, MSN, FNP-C Internal medicine

## 2021-11-13 ENCOUNTER — Encounter: Payer: Self-pay | Admitting: Nurse Practitioner

## 2021-11-13 ENCOUNTER — Ambulatory Visit: Payer: BC Managed Care – PPO | Admitting: Dermatology

## 2021-11-13 DIAGNOSIS — L219 Seborrheic dermatitis, unspecified: Secondary | ICD-10-CM | POA: Diagnosis not present

## 2021-11-13 MED ORDER — KETOCONAZOLE 2 % EX CREA: TOPICAL_CREAM | CUTANEOUS | 3 refills | Status: DC

## 2021-11-13 MED ORDER — HYDROCORTISONE 2.5 % EX CREA: TOPICAL_CREAM | CUTANEOUS | 3 refills | Status: DC

## 2021-11-13 MED ORDER — KETOCONAZOLE 2 % EX SHAM: MEDICATED_SHAMPOO | CUTANEOUS | 3 refills | Status: DC

## 2021-11-13 NOTE — Progress Notes (Signed)
   Follow-Up Visit   Subjective  Tyler Powers is a 61 y.o. male who presents for the following: Seborrheic Dermatitis (Of the face and scalp patient currently using Ketoconazole 2% shampoo and cream, and HC 2.5% cream - patient doing much better since starting treatment.).  The following portions of the chart were reviewed this encounter and updated as appropriate:   Tobacco  Allergies  Meds  Problems  Med Hx  Surg Hx  Fam Hx     Review of Systems:  No other skin or systemic complaints except as noted in HPI or Assessment and Plan.  Objective  Well appearing patient in no apparent distress; mood and affect are within normal limits.  A focused examination was performed including the face and scalp. Relevant physical exam findings are noted in the Assessment and Plan.  Face and scalp Mild Pink patches with greasy scale.  Much improved          Assessment & Plan  Seborrheic dermatitis Face and scalp Chronic and persistent condition with duration or expected duration over one year. Condition is symptomatic / bothersome to patient. Not to goal, but much improved.  Patient is pleased.  See photos compared to previous visit  Seborrheic Dermatitis - much improved compared to previous visit compared to photos -  is a chronic persistent rash characterized by pinkness and scaling most commonly of the mid face but also can occur on the scalp (dandruff), ears; mid chest, mid back and groin.  It tends to be exacerbated by stress and cooler weather.  People who have neurologic disease may experience new onset or exacerbation of existing seborrheic dermatitis.  The condition is not curable but treatable and can be controlled.  Continue Ketoconazole 2% shampoo 3d/wk and Ketoconazole alternating with HC 2.5% cream QOD.   Related Medications ketoconazole (NIZORAL) 2 % shampoo apply three times per week, use as a body wash at scalp, face, ears, massage onto affected areas and leave for a  few minutes if can, then rinse.  ketoconazole (NIZORAL) 2 % cream Apply topically to affected areas of face, scalp, and ears at 3 times weekly at bedtime on Monday Wed and Friday  hydrocortisone 2.5 % cream Apply topically to affected areas of face, scalp and ears 3 times weekly at bedtime. Tuesday Thursday Saturday  Return in about 1 year (around 11/14/2022).  Luther Redo, CMA, am acting as scribe for Sarina Ser, MD . Documentation: I have reviewed the above documentation for accuracy and completeness, and I agree with the above.  Sarina Ser, MD

## 2021-11-13 NOTE — Patient Instructions (Signed)
Due to recent changes in healthcare laws, you may see results of your pathology and/or laboratory studies on MyChart before the doctors have had a chance to review them. We understand that in some cases there may be results that are confusing or concerning to you. Please understand that not all results are received at the same time and often the doctors may need to interpret multiple results in order to provide you with the best plan of care or course of treatment. Therefore, we ask that you please give us 2 business days to thoroughly review all your results before contacting the office for clarification. Should we see a critical lab result, you will be contacted sooner.   If You Need Anything After Your Visit  If you have any questions or concerns for your doctor, please call our main line at [phone] and press option 4 to reach your doctor's medical assistant. If no one answers, please leave a voicemail as directed and we will return your call as soon as possible. Messages left after 4 pm will be answered the following business day.   You may also send us a message via MyChart. We typically respond to MyChart messages within 1-2 business days.  For prescription refills, please ask your pharmacy to contact our office. Our fax number is [phone].  If you have an urgent issue when the clinic is closed that cannot wait until the next business day, you can page your doctor at the number below.    Please note that while we do our best to be available for urgent issues outside of office hours, we are not available 24/7.   If you have an urgent issue and are unable to reach us, you may choose to seek medical care at your doctor's office, retail clinic, urgent care center, or emergency room.  If you have a medical emergency, please immediately call 911 or go to the emergency department.  Pager Numbers  - Dr. Kowalski: [phone]  - Dr. Moye: [phone]  - Dr. Stewart:  [phone]  In the event of inclement weather, please call our main line at [phone] for an update on the status of any delays or closures.  Dermatology Medication Tips: Please keep the boxes that topical medications come in in order to help keep track of the instructions about where and how to use these. Pharmacies typically print the medication instructions only on the boxes and not directly on the medication tubes.   If your medication is too expensive, please contact our office at [phone] option 4 or send us a message through MyChart.   We are unable to tell what your co-pay for medications will be in advance as this is different depending on your insurance coverage. However, we may be able to find a substitute medication at lower cost or fill out paperwork to get insurance to cover a needed medication.   If a prior authorization is required to get your medication covered by your insurance company, please allow us 1-2 business days to complete this process.  Drug prices often vary depending on where the prescription is filled and some pharmacies may offer cheaper prices.  The website www.goodrx.com contains coupons for medications through different pharmacies. The prices here do not account for what the cost may be with help from insurance (it may be cheaper with your insurance), but the website can give you the price if you did not use any insurance.  - You can print the associated coupon and take it with   your prescription to the pharmacy.  - You may also stop by our office during regular business hours and pick up a GoodRx coupon card.  - If you need your prescription sent electronically to a different pharmacy, notify our office through [facility] MyChart or by phone at [phone] option 4.     Si Usted Necesita Algo Despus de Su Visita  Tambin puede enviarnos un mensaje a travs de MyChart. Por lo general respondemos a los mensajes de MyChart en el transcurso de 1 a 2  das hbiles.  Para renovar recetas, por favor pida a su farmacia que se ponga en contacto con nuestra oficina. Nuestro nmero de fax es el [phone].  Si tiene un asunto urgente cuando la clnica est cerrada y que no puede esperar hasta el siguiente da hbil, puede llamar/localizar a su doctor(a) al nmero que aparece a continuacin.   Por favor, tenga en cuenta que aunque hacemos todo lo posible para estar disponibles para asuntos urgentes fuera del horario de oficina, no estamos disponibles las 24 horas del da, los 7 das de la semana.   Si tiene un problema urgente y no puede comunicarse con nosotros, puede optar por buscar atencin mdica  en el consultorio de su doctor(a), en una clnica privada, en un centro de atencin urgente o en una sala de emergencias.  Si tiene una emergencia mdica, por favor llame inmediatamente al 911 o vaya a la sala de emergencias.  Nmeros de bper  - Dr. Kowalski: [phone]  - Dra. Moye: [phone]  - Dra. Stewart: [phone]  En caso de inclemencias del tiempo, por favor llame a nuestra lnea principal al [phone] para una actualizacin sobre el estado de cualquier retraso o cierre.  Consejos para la medicacin en dermatologa: Por favor, guarde las cajas en las que vienen los medicamentos de uso tpico para ayudarle a seguir las instrucciones sobre dnde y cmo usarlos. Las farmacias generalmente imprimen las instrucciones del medicamento slo en las cajas y no directamente en los tubos del medicamento.   Si su medicamento es muy caro, por favor, pngase en contacto con nuestra oficina llamando al [phone] y presione la opcin 4 o envenos un mensaje a travs de MyChart.   No podemos decirle cul ser su copago por los medicamentos por adelantado ya que esto es diferente dependiendo de la cobertura de su seguro. Sin embargo, es posible que podamos encontrar un medicamento sustituto a menor costo o llenar un formulario para que el  seguro cubra el medicamento que se considera necesario.   Si se requiere una autorizacin previa para que su compaa de seguros cubra su medicamento, por favor permtanos de 1 a 2 das hbiles para completar este proceso.  Los precios de los medicamentos varan con frecuencia dependiendo del lugar de dnde se surte la receta y alguna farmacias pueden ofrecer precios ms baratos.  El sitio web www.goodrx.com tiene cupones para medicamentos de diferentes farmacias. Los precios aqu no tienen en cuenta lo que podra costar con la ayuda del seguro (puede ser ms barato con su seguro), pero el sitio web puede darle el precio si no utiliz ningn seguro.  - Puede imprimir el cupn correspondiente y llevarlo con su receta a la farmacia.  - Tambin puede pasar por nuestra oficina durante el horario de atencin regular y recoger una tarjeta de cupones de GoodRx.  - Si necesita que su receta se enve electrnicamente a una farmacia diferente, informe a nuestra oficina a travs de MyChart de [place]   o por telfono llamando al [phone] y presione la opcin 4.  

## 2021-11-14 ENCOUNTER — Telehealth: Payer: Self-pay

## 2021-11-14 MED ORDER — MELOXICAM 15 MG PO TABS: 15.0000 mg | ORAL_TABLET | Freq: Every day | ORAL | 1 refills | Status: DC

## 2021-11-14 NOTE — Telephone Encounter (Signed)
Per Alyssa we changed MELOXICAM to 15 mg daily

## 2021-11-17 ENCOUNTER — Telehealth: Payer: Self-pay

## 2021-11-17 DIAGNOSIS — E1165 Type 2 diabetes mellitus with hyperglycemia: Secondary | ICD-10-CM

## 2021-11-17 MED ORDER — DULAGLUTIDE 1.5 MG/0.5ML ~~LOC~~ SOAJ: 1.5000 mg | SUBCUTANEOUS | 1 refills | Status: DC

## 2021-11-17 NOTE — Telephone Encounter (Signed)
PA For TRULICITY 1.5 ml sent 0/31/59 @ 515 pm and came back approved.  Sent new rx to pharmacy with approval

## 2021-11-19 ENCOUNTER — Telehealth: Payer: Self-pay

## 2021-11-19 NOTE — Telephone Encounter (Signed)
Spoke to pt and informed him trulicity was approved

## 2021-11-22 ENCOUNTER — Encounter: Payer: Self-pay | Admitting: Dermatology

## 2022-01-23 ENCOUNTER — Encounter: Payer: BC Managed Care – PPO | Admitting: Nurse Practitioner

## 2022-01-31 ENCOUNTER — Encounter: Payer: BC Managed Care – PPO | Admitting: Nurse Practitioner

## 2022-02-19 ENCOUNTER — Other Ambulatory Visit: Payer: Self-pay | Admitting: Internal Medicine

## 2022-02-19 ENCOUNTER — Encounter: Payer: Self-pay | Admitting: Nurse Practitioner

## 2022-02-19 MED ORDER — OMEGA-3-ACID ETHYL ESTERS 1 G PO CAPS: ORAL_CAPSULE | ORAL | 1 refills | Status: DC

## 2022-02-19 NOTE — Telephone Encounter (Signed)
Done

## 2022-02-23 ENCOUNTER — Other Ambulatory Visit: Payer: Self-pay | Admitting: Nurse Practitioner

## 2022-03-07 ENCOUNTER — Ambulatory Visit (INDEPENDENT_AMBULATORY_CARE_PROVIDER_SITE_OTHER): Payer: BC Managed Care – PPO | Admitting: Nurse Practitioner

## 2022-03-07 ENCOUNTER — Encounter: Payer: Self-pay | Admitting: Nurse Practitioner

## 2022-03-07 VITALS — BP 104/70 | HR 64 | Temp 97.3°F | Resp 16 | Ht 68.0 in | Wt 202.2 lb

## 2022-03-07 DIAGNOSIS — Z125 Encounter for screening for malignant neoplasm of prostate: Secondary | ICD-10-CM

## 2022-03-07 DIAGNOSIS — Z760 Encounter for issue of repeat prescription: Secondary | ICD-10-CM

## 2022-03-07 DIAGNOSIS — E559 Vitamin D deficiency, unspecified: Secondary | ICD-10-CM

## 2022-03-07 DIAGNOSIS — Z0001 Encounter for general adult medical examination with abnormal findings: Secondary | ICD-10-CM

## 2022-03-07 DIAGNOSIS — R300 Dysuria: Secondary | ICD-10-CM | POA: Diagnosis not present

## 2022-03-07 DIAGNOSIS — E1165 Type 2 diabetes mellitus with hyperglycemia: Secondary | ICD-10-CM

## 2022-03-07 LAB — POCT GLYCOSYLATED HEMOGLOBIN (HGB A1C): Hemoglobin A1C: 6.3 % — AB (ref 4.0–5.6)

## 2022-03-07 MED ORDER — ROSUVASTATIN CALCIUM 10 MG PO TABS: 10.0000 mg | ORAL_TABLET | Freq: Every day | ORAL | 1 refills | Status: DC

## 2022-03-07 MED ORDER — METFORMIN HCL ER 500 MG PO TB24: 500.0000 mg | ORAL_TABLET | Freq: Two times a day (BID) | ORAL | 0 refills | Status: DC

## 2022-03-07 MED ORDER — MELOXICAM 15 MG PO TABS: 15.0000 mg | ORAL_TABLET | Freq: Every day | ORAL | 1 refills | Status: DC

## 2022-03-07 MED ORDER — DULAGLUTIDE 1.5 MG/0.5ML ~~LOC~~ SOAJ: 1.5000 mg | SUBCUTANEOUS | 1 refills | Status: DC

## 2022-03-07 NOTE — Progress Notes (Signed)
[facility] [place], [place] [address]  Internal MEDICINE  Office Visit Note  Patient Name: Tyler Powers  [id number]  [id number]  Date of Service: 03/07/2022  Chief Complaint  Patient presents with   Hyperlipidemia   Diabetes   Annual Exam     Hendrick presents for an annual well visit and physical exam.  Well-appearing 61 year old male with diabetes and hyperlipidemia.  He drives a truck for a living. --recently had his DOT physical for work --A1c improved to 6.3 from 6.5 in August. Gained 4 lbs since last visit. States he is going to start taking walks again.  BP and other vital signs are normal Needs refills, due for routine labs and his cologuard test was negative in January this year.  No new or worsening pain.    Current Medication: Outpatient Encounter Medications as of 03/07/2022  Medication Sig   blood glucose meter kit and supplies KIT Dispense based on patient and insurance preference. Use up to four times daily as directed.   hydrocortisone 2.5 % cream Apply topically to affected areas of face, scalp and ears 3 times weekly at bedtime. Tuesday Thursday Saturday   ibuprofen (ADVIL) 200 MG tablet Take 200 mg by mouth every 6 (six) hours as needed.   insulin glargine (LANTUS) 100 UNIT/ML Solostar Pen Inject 10 Units into the skin at bedtime.   Insulin Pen Needle (PEN NEEDLES) 31G X 5 MM MISC 10 Units by Does not apply route at bedtime.   ketoconazole (NIZORAL) 2 % cream Apply topically to affected areas of face, scalp, and ears at 3 times weekly at bedtime on Monday Wed and Friday   ketoconazole (NIZORAL) 2 % shampoo apply three times per week, use as a body wash at scalp, face, ears, massage onto affected areas and leave for a few minutes if can, then rinse.   naproxen (NAPROSYN) 250 MG tablet Take by mouth.   omega-3 acid ethyl esters (LOVAZA) 1 g capsule TAKE 2 CAPSULES BY MOUTH 2 TIMES DAILY.   [DISCONTINUED] Dulaglutide 1.5 MG/0.5ML SOPN  Inject 1.5 mg into the skin once a week.   [DISCONTINUED] meloxicam (MOBIC) 15 MG tablet Take 1 tablet (15 mg total) by mouth daily.   [DISCONTINUED] metFORMIN (GLUCOPHAGE-XR) 500 MG 24 hr tablet TAKE 1 TABLET (500 MG TOTAL) BY MOUTH IN THE MORNING AND AT BEDTIME   [DISCONTINUED] rosuvastatin (CRESTOR) 10 MG tablet Take 1 tablet (10 mg total) by mouth daily.   Dulaglutide 1.5 MG/0.5ML SOPN Inject 1.5 mg into the skin once a week.   meloxicam (MOBIC) 15 MG tablet Take 1 tablet (15 mg total) by mouth daily.   metFORMIN (GLUCOPHAGE-XR) 500 MG 24 hr tablet Take 1 tablet (500 mg total) by mouth in the morning and at bedtime.   rosuvastatin (CRESTOR) 10 MG tablet Take 1 tablet (10 mg total) by mouth daily.   No facility-administered encounter medications on file as of 03/07/2022.    Surgical History: Past Surgical History:  Procedure Laterality Date   screws in left leg Left     Medical History: Past Medical History:  Diagnosis Date   Diabetes mellitus without complication ([place])    Hyperlipidemia     Family History: Family History  Problem Relation Age of Onset   Cancer Mother    COPD Father    Asthma Father    Asthma Brother    Diabetes Maternal Aunt    Varicose Veins Maternal Grandmother     Social History  Socioeconomic History   Marital status: Single    Spouse name: Not on file   Number of children: Not on file   Years of education: Not on file   Highest education level: Not on file  Occupational History   Not on file  Tobacco Use   Smoking status: Former    Types: Cigarettes   Smokeless tobacco: Never  Substance and Sexual Activity   Alcohol use: Never   Drug use: Never   Sexual activity: Not on file  Other Topics Concern   Not on file  Social History Narrative   Not on file   Social Determinants of Health   Financial Resource Strain: Not on file  Food Insecurity: Not on file  Transportation Needs: Not on file  Physical Activity: Not on file  Stress:  Not on file  Social Connections: Not on file  Intimate Partner Violence: Not on file      Review of Systems  Constitutional:  Negative for activity change, appetite change, chills, fatigue, fever and unexpected weight change.  HENT: Negative.  Negative for congestion, ear pain, rhinorrhea, sore throat and trouble swallowing.   Eyes: Negative.   Respiratory: Negative.  Negative for cough, chest tightness, shortness of breath and wheezing.   Cardiovascular: Negative.  Negative for chest pain.  Gastrointestinal: Negative.  Negative for abdominal pain, blood in stool, constipation, diarrhea, nausea and vomiting.  Endocrine: Negative.   Genitourinary: Negative.  Negative for difficulty urinating, dysuria, frequency, hematuria and urgency.  Musculoskeletal: Negative.  Negative for arthralgias, back pain, joint swelling, myalgias and neck pain.  Skin: Negative.  Negative for rash and wound.  Allergic/Immunologic: Negative.  Negative for immunocompromised state.  Neurological: Negative.  Negative for dizziness, seizures, numbness and headaches.  Hematological: Negative.   Psychiatric/Behavioral: Negative.  Negative for behavioral problems, self-injury and suicidal ideas. The patient is not nervous/anxious.     Vital Signs: BP 104/70   Pulse 64   Temp (!) 97.3 F (36.3 C)   Resp 16   Ht _0  (1.727 m)   Wt 202 lb 3.2 oz (91.7 kg)   SpO2 98%   BMI 30.74 kg/m    Physical Exam Vitals reviewed.  Constitutional:      General: He is awake. He is not in acute distress.    Appearance: Normal appearance. He is well-developed, well-groomed and overweight. He is not ill-appearing or diaphoretic.  HENT:     Head: Normocephalic and atraumatic.     Right Ear: Tympanic membrane, ear canal and external ear normal.     Left Ear: Tympanic membrane, ear canal and external ear normal.     Nose: Nose normal. No congestion or rhinorrhea.     Mouth/Throat:     Lips: Pink.     Mouth: Mucous  membranes are moist.     Pharynx: Oropharynx is clear. Uvula midline. No oropharyngeal exudate.  Eyes:     General: Lids are normal. Vision grossly intact. Gaze aligned appropriately. No scleral icterus.       Right eye: No discharge.        Left eye: No discharge.     Extraocular Movements: Extraocular movements intact.     Conjunctiva/sclera: Conjunctivae normal.     Pupils: Pupils are equal, round, and reactive to light.     Funduscopic exam:    Right eye: Red reflex present.        Left eye: Red reflex present. Neck:     Thyroid: No thyromegaly.  Vascular: No JVD.     Trachea: Trachea and phonation normal. No tracheal deviation.  Cardiovascular:     Rate and Rhythm: Normal rate and regular rhythm.     Pulses: Normal pulses.          Dorsalis pedis pulses are 2+ on the right side and 2+ on the left side.       Posterior tibial pulses are 2+ on the right side and 2+ on the left side.     Heart sounds: Normal heart sounds, S1 normal and S2 normal. No murmur heard.    No friction rub. No gallop.  Pulmonary:     Effort: Pulmonary effort is normal. No accessory muscle usage or respiratory distress.     Breath sounds: Normal breath sounds and air entry. No stridor. No wheezing or rales.  Chest:     Chest wall: No tenderness.  Abdominal:     General: Bowel sounds are normal. There is no distension.     Palpations: Abdomen is soft. There is no shifting dullness, fluid wave, mass or pulsatile mass.     Tenderness: There is no abdominal tenderness. There is no guarding or rebound.  Musculoskeletal:        General: No tenderness or deformity.     Cervical back: Normal range of motion and neck supple.     Right lower leg: No edema.     Left lower leg: No edema.     Right foot: Normal range of motion. No deformity, bunion, Charcot foot, foot drop or prominent metatarsal heads.     Left foot: Decreased range of motion. No deformity, bunion, Charcot foot, foot drop or prominent  metatarsal heads.  Feet:     Right foot:     Protective Sensation: 6 sites tested.  6 sites sensed.     Skin integrity: Dry skin present. No ulcer, blister, skin breakdown, erythema, warmth, callus or fissure.     Toenail Condition: Right toenails are long.     Left foot:     Protective Sensation: 6 sites tested.  6 sites sensed.     Skin integrity: Callus and dry skin present. No ulcer, blister, skin breakdown, erythema, warmth or fissure.     Toenail Condition: Left toenails are abnormally thick and long.  Lymphadenopathy:     Cervical: No cervical adenopathy.  Skin:    General: Skin is warm and dry.     Capillary Refill: Capillary refill takes less than 2 seconds.     Coloration: Skin is not pale.     Findings: No erythema or rash.  Neurological:     Mental Status: He is alert and oriented to person, place, and time.     Cranial Nerves: No cranial nerve deficit.     Motor: No abnormal muscle tone.     Coordination: Coordination normal.     Gait: Gait normal.     Deep Tendon Reflexes: Reflexes are normal and symmetric.  Psychiatric:        Mood and Affect: Mood and affect normal.        Behavior: Behavior normal. Behavior is cooperative.        Thought Content: Thought content normal.        Judgment: Judgment normal.        Assessment/Plan: 1. Encounter for routine adult health examination with abnormal findings Age-appropriate preventive screenings and vaccinations discussed, annual physical exam completed. Routine labs for health maintenance ordered, see below. PHM updated.  - CBC with  Differential/Platelet - CMP14+EGFR - Lipid Profile - PSA Total (Reflex To Free) - Vitamin D (25 hydroxy)  2. Uncontrolled type 2 diabetes mellitus with hyperglycemia (HCC) A1C slightly improved to 6.3, continue current medications as prescribed. Routine labs ordered.  - POCT glycosylated hemoglobin (Hb A1C) - Urine Microalbumin w/creat. ratio - CBC with Differential/Platelet -  CMP14+EGFR - Lipid Profile  3. Vitamin D deficiency Routine lab ordered - Vitamin D (25 hydroxy)  4. Dysuria Routine urinalysis done - UA/M w/rflx Culture, Routine  5. Screening for prostate cancer Routine lab ordered - PSA Total (Reflex To Free)  6. Medication refill - Dulaglutide 1.5 MG/0.5ML SOPN; Inject 1.5 mg into the skin once a week.  Dispense: 6 mL; Refill: 1 - meloxicam (MOBIC) 15 MG tablet; Take 1 tablet (15 mg total) by mouth daily.  Dispense: 90 tablet; Refill: 1 - metFORMIN (GLUCOPHAGE-XR) 500 MG 24 hr tablet; Take 1 tablet (500 mg total) by mouth in the morning and at bedtime.  Dispense: 180 tablet; Refill: 0 - rosuvastatin (CRESTOR) 10 MG tablet; Take 1 tablet (10 mg total) by mouth daily.  Dispense: 90 tablet; Refill: 1      General Counseling: Laura verbalizes understanding of the findings of todays visit and agrees with plan of treatment. I have discussed any further diagnostic evaluation that may be needed or ordered today. We also reviewed his medications today. he has been encouraged to call the office with any questions or concerns that should arise related to todays visit.    Orders Placed This Encounter  Procedures   UA/M w/rflx Culture, Routine   Urine Microalbumin w/creat. ratio   CBC with Differential/Platelet   CMP14+EGFR   Lipid Profile   PSA Total (Reflex To Free)   Vitamin D (25 hydroxy)   POCT glycosylated hemoglobin (Hb A1C)    Meds ordered this encounter  Medications   Dulaglutide 1.5 MG/0.5ML SOPN    Sig: Inject 1.5 mg into the skin once a week.    Dispense:  6 mL    Refill:  1    For future refills, PA approved ffor TRULICITY 1.5 mg 7/89/38   meloxicam (MOBIC) 15 MG tablet    Sig: Take 1 tablet (15 mg total) by mouth daily.    Dispense:  90 tablet    Refill:  1    Notice dose increased to 15 mg daily 11/14/21   metFORMIN (GLUCOPHAGE-XR) 500 MG 24 hr tablet    Sig: Take 1 tablet (500 mg total) by mouth in the morning and at  bedtime.    Dispense:  180 tablet    Refill:  0    For future refills   rosuvastatin (CRESTOR) 10 MG tablet    Sig: Take 1 tablet (10 mg total) by mouth daily.    Dispense:  90 tablet    Refill:  1    Return in about 3 months (around 06/07/2022) for F/U, Recheck A1C, Marcell Chavarin PCP.   Total time spent:30 Minutes Time spent includes review of chart, medications, test results, and follow up plan with the patient.   [place] Controlled Substance Database was reviewed by me.  This patient was seen by Jonetta Osgood, FNP-C in collaboration with Dr. Clayborn Bigness as a part of collaborative care agreement.  Jakhi Dishman R. Valetta Fuller, MSN, FNP-C Internal medicine

## 2022-03-08 LAB — UA/M W/RFLX CULTURE, ROUTINE
Bilirubin, UA: NEGATIVE
Glucose, UA: NEGATIVE
Ketones, UA: NEGATIVE
Leukocytes,UA: NEGATIVE
Nitrite, UA: NEGATIVE
Protein,UA: NEGATIVE
RBC, UA: NEGATIVE
Specific Gravity, UA: 1.02 (ref 1.005–1.030)
Urobilinogen, Ur: 1 mg/dL (ref 0.2–1.0)
pH, UA: 7 (ref 5.0–7.5)

## 2022-03-08 LAB — MICROALBUMIN / CREATININE URINE RATIO
Creatinine, Urine: 92.7 mg/dL
Microalb/Creat Ratio: 6 mg/g creat (ref 0–29)
Microalbumin, Urine: 5.4 ug/mL

## 2022-03-08 LAB — MICROSCOPIC EXAMINATION
Bacteria, UA: NONE SEEN
Casts: NONE SEEN /lpf
Epithelial Cells (non renal): NONE SEEN /hpf (ref 0–10)
RBC, Urine: NONE SEEN /hpf (ref 0–2)
WBC, UA: NONE SEEN /hpf (ref 0–5)

## 2022-03-15 LAB — CMP14+EGFR
ALT: 35 IU/L (ref 0–44)
AST: 24 IU/L (ref 0–40)
Albumin/Globulin Ratio: 2.1 (ref 1.2–2.2)
Albumin: 4.6 g/dL (ref 3.8–4.9)
Alkaline Phosphatase: 65 IU/L (ref 44–121)
BUN/Creatinine Ratio: 17 (ref 9–20)
BUN: 13 mg/dL (ref 6–24)
Bilirubin Total: 0.5 mg/dL (ref 0.0–1.2)
CO2: 23 mmol/L (ref 20–29)
Calcium: 9.8 mg/dL (ref 8.7–10.2)
Chloride: 101 mmol/L (ref 96–106)
Creatinine, Ser: 0.78 mg/dL (ref 0.76–1.27)
Globulin, Total: 2.2 g/dL (ref 1.5–4.5)
Glucose: 130 mg/dL — ABNORMAL HIGH (ref 70–99)
Potassium: 4.7 mmol/L (ref 3.5–5.2)
Sodium: 139 mmol/L (ref 134–144)
Total Protein: 6.8 g/dL (ref 6.0–8.5)
eGFR: 103 mL/min/{1.73_m2} (ref >59)

## 2022-03-15 LAB — CBC WITH DIFFERENTIAL/PLATELET
Basophils Absolute: 0.1 10*3/uL (ref 0.0–0.2)
Basos: 1 %
EOS (ABSOLUTE): 0.2 10*3/uL (ref 0.0–0.4)
Eos: 2 %
Hematocrit: 48.3 % (ref 37.5–51.0)
Hemoglobin: 16.5 g/dL (ref 13.0–17.7)
Immature Grans (Abs): 0 10*3/uL (ref 0.0–0.1)
Immature Granulocytes: 0 %
Lymphocytes Absolute: 2.7 10*3/uL (ref 0.7–3.1)
Lymphs: 35 %
MCH: 31.9 pg (ref 26.6–33.0)
MCHC: 34.2 g/dL (ref 31.5–35.7)
MCV: 93 fL (ref 79–97)
Monocytes Absolute: 0.4 10*3/uL (ref 0.1–0.9)
Monocytes: 6 %
Neutrophils Absolute: 4.5 10*3/uL (ref 1.4–7.0)
Neutrophils: 56 %
Platelets: 225 10*3/uL (ref 150–450)
RBC: 5.17 x10E6/uL (ref 4.14–5.80)
RDW: 12.3 % (ref 11.6–15.4)
WBC: 7.9 10*3/uL (ref 3.4–10.8)

## 2022-03-15 LAB — PSA TOTAL (REFLEX TO FREE): Prostate Specific Ag, Serum: 0.6 ng/mL (ref 0.0–4.0)

## 2022-03-15 LAB — LIPID PANEL
Chol/HDL Ratio: 2.6 ratio (ref 0.0–5.0)
Cholesterol, Total: 113 mg/dL (ref 100–199)
HDL: 44 mg/dL (ref >39)
LDL Chol Calc (NIH): 43 mg/dL (ref 0–99)
Triglycerides: 153 mg/dL — ABNORMAL HIGH (ref 0–149)
VLDL Cholesterol Cal: 26 mg/dL (ref 5–40)

## 2022-03-15 LAB — VITAMIN D 25 HYDROXY (VIT D DEFICIENCY, FRACTURES): Vit D, 25-Hydroxy: 11.8 ng/mL — ABNORMAL LOW (ref 30.0–100.0)

## 2022-06-06 ENCOUNTER — Ambulatory Visit (INDEPENDENT_AMBULATORY_CARE_PROVIDER_SITE_OTHER): Payer: BC Managed Care – PPO | Admitting: Nurse Practitioner

## 2022-06-06 ENCOUNTER — Encounter: Payer: Self-pay | Admitting: Nurse Practitioner

## 2022-06-06 VITALS — BP 125/75 | HR 67 | Temp 97.8°F | Resp 16 | Ht 68.0 in | Wt 208.6 lb

## 2022-06-06 DIAGNOSIS — E559 Vitamin D deficiency, unspecified: Secondary | ICD-10-CM

## 2022-06-06 DIAGNOSIS — E782 Mixed hyperlipidemia: Secondary | ICD-10-CM | POA: Diagnosis not present

## 2022-06-06 DIAGNOSIS — E1165 Type 2 diabetes mellitus with hyperglycemia: Secondary | ICD-10-CM

## 2022-06-06 LAB — POCT GLYCOSYLATED HEMOGLOBIN (HGB A1C): Hemoglobin A1C: 6.6 % — AB (ref 4.0–5.6)

## 2022-06-06 MED ORDER — VITAMIN D (ERGOCALCIFEROL) 1.25 MG (50000 UNIT) PO CAPS: 50000.0000 [IU] | ORAL_CAPSULE | ORAL | 1 refills | Status: DC

## 2022-06-06 MED ORDER — ROSUVASTATIN CALCIUM 10 MG PO TABS: 10.0000 mg | ORAL_TABLET | Freq: Every day | ORAL | 1 refills | Status: DC

## 2022-06-06 MED ORDER — DULAGLUTIDE 1.5 MG/0.5ML ~~LOC~~ SOAJ: 1.5000 mg | SUBCUTANEOUS | 1 refills | Status: DC

## 2022-06-06 MED ORDER — INSULIN GLARGINE 100 UNIT/ML SOLOSTAR PEN: 10.0000 [IU] | PEN_INJECTOR | Freq: Every day | SUBCUTANEOUS | 3 refills | Status: DC

## 2022-06-06 NOTE — Progress Notes (Signed)
[facility] [place], [place] [address]  Internal MEDICINE  Office Visit Note  Patient Name: Tyler Powers  [id number]  [id number]  Date of Service: 06/06/2022  Chief Complaint  Patient presents with   Follow-up   Hyperlipidemia   Diabetes    HPI Tyler Powers presents for a follow-up visit for diabetes, high cholesterol, vitamin D deficiency and lab results.  Diabetes -- A1c 6.6, slight increase but remains stable, gained 6 lbs also. Difficulty getting trulicity sometimes.  High cholesterol --  having trouble getting lovaza. Lipid panel is improving.  Low vitamin d -- taking multivitamin. 11.8 Reviewed rest of the lab results.     Current Medication: Outpatient Encounter Medications as of 06/06/2022  Medication Sig   blood glucose meter kit and supplies KIT Dispense based on patient and insurance preference. Use up to four times daily as directed.   hydrocortisone 2.5 % cream Apply topically to affected areas of face, scalp and ears 3 times weekly at bedtime. Tuesday Thursday Saturday   ibuprofen (ADVIL) 200 MG tablet Take 200 mg by mouth every 6 (six) hours as needed.   Insulin Pen Needle (PEN NEEDLES) 31G X 5 MM MISC 10 Units by Does not apply route at bedtime.   ketoconazole (NIZORAL) 2 % cream Apply topically to affected areas of face, scalp, and ears at 3 times weekly at bedtime on Monday Wed and Friday   ketoconazole (NIZORAL) 2 % shampoo apply three times per week, use as a body wash at scalp, face, ears, massage onto affected areas and leave for a few minutes if can, then rinse.   meloxicam (MOBIC) 15 MG tablet Take 1 tablet (15 mg total) by mouth daily.   metFORMIN (GLUCOPHAGE-XR) 500 MG 24 hr tablet Take 1 tablet (500 mg total) by mouth in the morning and at bedtime.   naproxen (NAPROSYN) 250 MG tablet Take by mouth.   omega-3 acid ethyl esters (LOVAZA) 1 g capsule TAKE 2 CAPSULES BY MOUTH 2 TIMES DAILY.   Vitamin D, Ergocalciferol, (DRISDOL) 1.25 MG  (50000 UNIT) CAPS capsule Take 1 capsule (50,000 Units total) by mouth every 7 (seven) days.   [DISCONTINUED] Dulaglutide 1.5 MG/0.5ML SOPN Inject 1.5 mg into the skin once a week.   [DISCONTINUED] insulin glargine (LANTUS) 100 UNIT/ML Solostar Pen Inject 10 Units into the skin at bedtime.   [DISCONTINUED] rosuvastatin (CRESTOR) 10 MG tablet Take 1 tablet (10 mg total) by mouth daily.   Dulaglutide 1.5 MG/0.5ML SOPN Inject 1.5 mg into the skin once a week.   insulin glargine (LANTUS) 100 UNIT/ML Solostar Pen Inject 10 Units into the skin at bedtime.   rosuvastatin (CRESTOR) 10 MG tablet Take 1 tablet (10 mg total) by mouth daily.   No facility-administered encounter medications on file as of 06/06/2022.    Surgical History: Past Surgical History:  Procedure Laterality Date   screws in left leg Left     Medical History: Past Medical History:  Diagnosis Date   Diabetes mellitus without complication ([place])    Hyperlipidemia     Family History: Family History  Problem Relation Age of Onset   Cancer Mother    COPD Father    Asthma Father    Asthma Brother    Diabetes Maternal Aunt    Varicose Veins Maternal Grandmother     Social History   Socioeconomic History   Marital status: Single    Spouse name: Not on file   Number of children: Not on file  Years of education: Not on file   Highest education level: Not on file  Occupational History   Not on file  Tobacco Use   Smoking status: Former    Types: Cigarettes   Smokeless tobacco: Never  Substance and Sexual Activity   Alcohol use: Never   Drug use: Never   Sexual activity: Not on file  Other Topics Concern   Not on file  Social History Narrative   Not on file   Social Determinants of Health   Financial Resource Strain: Not on file  Food Insecurity: Not on file  Transportation Needs: Not on file  Physical Activity: Not on file  Stress: Not on file  Social Connections: Not on file  Intimate Partner Violence:  Not on file      Review of Systems  Constitutional:  Negative for chills, fatigue and unexpected weight change.  HENT:  Negative for congestion, rhinorrhea, sneezing and sore throat.   Eyes:  Negative for redness.  Respiratory:  Negative for cough, chest tightness and shortness of breath.   Cardiovascular:  Negative for chest pain and palpitations.  Gastrointestinal:  Negative for abdominal pain, constipation, diarrhea, nausea and vomiting.  Genitourinary:  Negative for dysuria and frequency.  Musculoskeletal:  Negative for arthralgias, back pain, joint swelling and neck pain.  Skin:  Negative for rash.  Neurological: Negative.  Negative for tremors and numbness.  Hematological:  Negative for adenopathy. Does not bruise/bleed easily.  Psychiatric/Behavioral:  Negative for behavioral problems (Depression), sleep disturbance and suicidal ideas. The patient is not nervous/anxious.     Vital Signs: BP 125/75   Pulse 67   Temp 97.8 F (36.6 C)   Resp 16   Ht '5\' 8"'$  (1.727 m)   Wt 208 lb 9.6 oz (94.6 kg)   SpO2 97%   BMI 31.72 kg/m    Physical Exam Vitals reviewed.  Constitutional:      General: He is not in acute distress.    Appearance: Normal appearance. He is obese. He is not ill-appearing.  HENT:     Head: Normocephalic and atraumatic.  Eyes:     Pupils: Pupils are equal, round, and reactive to light.  Cardiovascular:     Rate and Rhythm: Normal rate and regular rhythm.  Pulmonary:     Effort: Pulmonary effort is normal. No respiratory distress.  Neurological:     Mental Status: He is alert and oriented to person, place, and time.  Psychiatric:        Mood and Affect: Mood normal.        Behavior: Behavior normal.        Assessment/Plan: 1. Uncontrolled type 2 diabetes mellitus with hyperglycemia (HCC) A1c slightly increased at 6.6. continue trulicity as prescribed. Continue lantus 10 units daily. Having some difficulty getting trulicity refills due to  backorder. If this continues, will switch to mounjaro.  - POCT glycosylated hemoglobin (Hb A1C) - Dulaglutide 1.5 MG/0.5ML SOPN; Inject 1.5 mg into the skin once a week.  Dispense: 6 mL; Refill: 1 - insulin glargine (LANTUS) 100 UNIT/ML Solostar Pen; Inject 10 Units into the skin at bedtime.  Dispense: 9 mL; Refill: 3  2. Vitamin D deficiency Take vitamin D 50,000 unit prescription capsule once weekly for 6 months, will repeat vitamin D level in 6 months.  - Vitamin D, Ergocalciferol, (DRISDOL) 1.25 MG (50000 UNIT) CAPS capsule; Take 1 capsule (50,000 Units total) by mouth every 7 (seven) days.  Dispense: 12 capsule; Refill: 1  3. Mixed hyperlipidemia Continue  rosuvastatin as prescribed. While pharmacy is out of stock of lovaza, may take OTC fish oil supplement 2 g twice daily.  - rosuvastatin (CRESTOR) 10 MG tablet; Take 1 tablet (10 mg total) by mouth daily.  Dispense: 90 tablet; Refill: 1   General Counseling: Tyler Powers verbalizes understanding of the findings of todays visit and agrees with plan of treatment. I have discussed any further diagnostic evaluation that may be needed or ordered today. We also reviewed his medications today. he has been encouraged to call the office with any questions or concerns that should arise related to todays visit.    Orders Placed This Encounter  Procedures   POCT glycosylated hemoglobin (Hb A1C)    Meds ordered this encounter  Medications   Vitamin D, Ergocalciferol, (DRISDOL) 1.25 MG (50000 UNIT) CAPS capsule    Sig: Take 1 capsule (50,000 Units total) by mouth every 7 (seven) days.    Dispense:  12 capsule    Refill:  1   Dulaglutide 1.5 MG/0.5ML SOPN    Sig: Inject 1.5 mg into the skin once a week.    Dispense:  6 mL    Refill:  1    For future refills, PA approved ffor TRULICITY 1.5 mg 2/42/35   insulin glargine (LANTUS) 100 UNIT/ML Solostar Pen    Sig: Inject 10 Units into the skin at bedtime.    Dispense:  9 mL    Refill:  3    Pharmacy  requesting 90 day supply, 1 pen lasts 30 days so 3 pens = 3 month supply.   rosuvastatin (CRESTOR) 10 MG tablet    Sig: Take 1 tablet (10 mg total) by mouth daily.    Dispense:  90 tablet    Refill:  1    For future refills    Return in about 3 months (around 09/10/2022) for F/U, Recheck A1C, Eadie Repetto PCP.   Total time spent:30 Minutes Time spent includes review of chart, medications, test results, and follow up plan with the patient.   [place] Controlled Substance Database was reviewed by me.  This patient was seen by Jonetta Osgood, FNP-C in collaboration with Dr. Clayborn Bigness as a part of collaborative care agreement.   Tyler Laneve R. Valetta Fuller, MSN, FNP-C Internal medicine

## 2022-07-09 ENCOUNTER — Encounter: Payer: Self-pay | Admitting: Nurse Practitioner

## 2022-07-11 ENCOUNTER — Other Ambulatory Visit: Payer: Self-pay | Admitting: Nurse Practitioner

## 2022-07-11 MED ORDER — TIRZEPATIDE 5 MG/0.5ML ~~LOC~~ SOAJ: 5.0000 mg | SUBCUTANEOUS | 5 refills | Status: DC

## 2022-07-31 ENCOUNTER — Encounter: Payer: Self-pay | Admitting: Nurse Practitioner

## 2022-07-31 ENCOUNTER — Other Ambulatory Visit: Payer: Self-pay

## 2022-07-31 MED ORDER — PEN NEEDLES 31G X 5 MM MISC: 10.0000 [IU] | Freq: Every day | 3 refills | Status: AC

## 2022-08-21 ENCOUNTER — Other Ambulatory Visit: Payer: Self-pay | Admitting: Nurse Practitioner

## 2022-08-21 DIAGNOSIS — Z760 Encounter for issue of repeat prescription: Secondary | ICD-10-CM

## 2022-09-05 ENCOUNTER — Other Ambulatory Visit: Payer: Self-pay | Admitting: Internal Medicine

## 2022-09-12 ENCOUNTER — Ambulatory Visit: Payer: BC Managed Care – PPO | Admitting: Nurse Practitioner

## 2022-09-12 ENCOUNTER — Encounter: Payer: Self-pay | Admitting: Nurse Practitioner

## 2022-09-12 VITALS — BP 110/68 | HR 60 | Temp 96.9°F | Resp 16 | Ht 68.0 in | Wt 197.7 lb

## 2022-09-12 DIAGNOSIS — E782 Mixed hyperlipidemia: Secondary | ICD-10-CM | POA: Diagnosis not present

## 2022-09-12 DIAGNOSIS — E1165 Type 2 diabetes mellitus with hyperglycemia: Secondary | ICD-10-CM | POA: Diagnosis not present

## 2022-09-12 DIAGNOSIS — E559 Vitamin D deficiency, unspecified: Secondary | ICD-10-CM | POA: Diagnosis not present

## 2022-09-12 DIAGNOSIS — E1169 Type 2 diabetes mellitus with other specified complication: Secondary | ICD-10-CM | POA: Insufficient documentation

## 2022-09-12 LAB — POCT GLYCOSYLATED HEMOGLOBIN (HGB A1C): Hemoglobin A1C: 6.4 % — AB (ref 4.0–5.6)

## 2022-09-12 NOTE — Progress Notes (Signed)
[facility] [address] [place], [place] [address]  Internal MEDICINE  Office Visit Note  Patient Name: Tyler Powers  [id number]  [id number]  Date of Service: 09/12/2022  Chief Complaint  Patient presents with   Diabetes   Hyperlipidemia   Follow-up    HPI Tyler Powers presents for a follow-up visit for diabetes, high cholesterol and low vitamin D Diabetes -- A1c is 6.4 which is improved from 6.6 at previous office visit. Lost 11 lbs since last visit as well. Does have some side effects on maounjaro but is managing well and wants to continue.  Hyperlipidemia -- takes rosuvastatin Vitamin D deficiency -- takes weekly supplement.     Current Medication: Outpatient Encounter Medications as of 09/12/2022  Medication Sig   blood glucose meter kit and supplies KIT Dispense based on patient and insurance preference. Use up to four times daily as directed.   hydrocortisone 2.5 % cream Apply topically to affected areas of face, scalp and ears 3 times weekly at bedtime. Tuesday Thursday Saturday   ibuprofen (ADVIL) 200 MG tablet Take 200 mg by mouth every 6 (six) hours as needed.   insulin glargine (LANTUS) 100 UNIT/ML Solostar Pen Inject 10 Units into the skin at bedtime.   Insulin Pen Needle (PEN NEEDLES) 31G X 5 MM MISC 10 Units by Does not apply route at bedtime.   ketoconazole (NIZORAL) 2 % cream Apply topically to affected areas of face, scalp, and ears at 3 times weekly at bedtime on Monday Wed and Friday   ketoconazole (NIZORAL) 2 % shampoo apply three times per week, use as a body wash at scalp, face, ears, massage onto affected areas and leave for a few minutes if can, then rinse.   meloxicam (MOBIC) 15 MG tablet Take 1 tablet (15 mg total) by mouth daily.   metFORMIN (GLUCOPHAGE-XR) 500 MG 24 hr tablet TAKE 1 TABLET (500 MG TOTAL) BY MOUTH IN THE MORNING AND AT BEDTIME   naproxen (NAPROSYN) 250 MG tablet Take by mouth.   omega-3 acid ethyl esters (LOVAZA) 1 g capsule TAKE 2  CAPSULES BY MOUTH TWICE A DAY   rosuvastatin (CRESTOR) 10 MG tablet Take 1 tablet (10 mg total) by mouth daily.   tirzepatide [facility]) 5 MG/0.5ML Pen Inject 5 mg into the skin once a week.   Vitamin D, Ergocalciferol, (DRISDOL) 1.25 MG (50000 UNIT) CAPS capsule Take 1 capsule (50,000 Units total) by mouth every 7 (seven) days.   No facility-administered encounter medications on file as of 09/12/2022.    Surgical History: Past Surgical History:  Procedure Laterality Date   screws in left leg Left     Medical History: Past Medical History:  Diagnosis Date   Diabetes mellitus without complication (HCC)    Hyperlipidemia     Family History: Family History  Problem Relation Age of Onset   Cancer Mother    COPD Father    Asthma Father    Asthma Brother    Diabetes Maternal Aunt    Varicose Veins Maternal Grandmother     Social History   Socioeconomic History   Marital status: Single    Spouse name: Not on file   Number of children: Not on file   Years of education: Not on file   Highest education level: Not on file  Occupational History   Not on file  Tobacco Use   Smoking status: Former    Types: Cigarettes   Smokeless tobacco: Never  Substance and Sexual Activity   Alcohol  use: Never   Drug use: Never   Sexual activity: Not on file  Other Topics Concern   Not on file  Social History Narrative   Not on file   Social Determinants of Health   Financial Resource Strain: Not on file  Food Insecurity: Not on file  Transportation Needs: Not on file  Physical Activity: Not on file  Stress: Not on file  Social Connections: Not on file  Intimate Partner Violence: Not on file      Review of Systems  Constitutional:  Negative for chills, fatigue and unexpected weight change.  HENT:  Negative for congestion, rhinorrhea, sneezing and sore throat.   Eyes:  Negative for redness.  Respiratory:  Negative for cough, chest tightness and shortness of breath.    Cardiovascular:  Negative for chest pain and palpitations.  Gastrointestinal:  Negative for abdominal pain, constipation, diarrhea, nausea and vomiting.  Genitourinary:  Negative for dysuria and frequency.  Musculoskeletal:  Negative for arthralgias, back pain, joint swelling and neck pain.  Skin:  Negative for rash.  Neurological: Negative.  Negative for tremors and numbness.  Hematological:  Negative for adenopathy. Does not bruise/bleed easily.  Psychiatric/Behavioral:  Negative for behavioral problems (Depression), sleep disturbance and suicidal ideas. The patient is not nervous/anxious.     Vital Signs: BP 110/68   Pulse 60   Temp (!) 96.9 F (36.1 C)   Resp 16   Ht 5\' 8"  (1.727 m)   Wt 197 lb 11.2 oz (89.7 kg)   SpO2 95%   BMI 30.06 kg/m    Physical Exam Vitals reviewed.  Constitutional:      General: He is not in acute distress.    Appearance: Normal appearance. He is obese. He is not ill-appearing.  HENT:     Head: Normocephalic and atraumatic.  Eyes:     Pupils: Pupils are equal, round, and reactive to light.  Cardiovascular:     Rate and Rhythm: Normal rate and regular rhythm.  Pulmonary:     Effort: Pulmonary effort is normal. No respiratory distress.  Neurological:     Mental Status: He is alert and oriented to person, place, and time.  Psychiatric:        Mood and Affect: Mood normal.        Behavior: Behavior normal.        Assessment/Plan: 1. Uncontrolled type 2 diabetes mellitus with hyperglycemia (HCC) A1c improved to 6.4, continue medications as prescribed. Repeat A1c in November with annual labs.  - POCT glycosylated hemoglobin (Hb A1C)  2. Vitamin D deficiency Continue weekly vitamin D supplement as prescribed.   3. Mixed hyperlipidemia Continue rosuvastatin as prescribed.    General Counseling: Lidia Collum understanding of the findings of todays visit and agrees with plan of treatment. I have discussed any further diagnostic  evaluation that may be needed or ordered today. We also reviewed his medications today. he has been encouraged to call the office with any questions or concerns that should arise related to todays visit.    Orders Placed This Encounter  Procedures   POCT glycosylated hemoglobin (Hb A1C)    No orders of the defined types were placed in this encounter.   Return for previously scheduled, CPE, Ahtziry Saathoff PCP in november.   Total time spent:30 Minutes Time spent includes review of chart, medications, test results, and follow up plan with the patient.   [place] Controlled Substance Database was reviewed by me.  This patient was seen by Sallyanne Kuster, FNP-C in  collaboration with Dr. Clayborn Bigness as a part of collaborative care agreement.   Darcey Demma R. Valetta Fuller, MSN, FNP-C Internal medicine

## 2022-11-18 ENCOUNTER — Other Ambulatory Visit: Payer: Self-pay | Admitting: Nurse Practitioner

## 2022-11-18 DIAGNOSIS — E559 Vitamin D deficiency, unspecified: Secondary | ICD-10-CM

## 2022-11-19 ENCOUNTER — Ambulatory Visit: Payer: BC Managed Care – PPO | Admitting: Dermatology

## 2022-11-22 ENCOUNTER — Other Ambulatory Visit: Payer: Self-pay | Admitting: Nurse Practitioner

## 2022-11-22 DIAGNOSIS — Z760 Encounter for issue of repeat prescription: Secondary | ICD-10-CM

## 2022-11-24 ENCOUNTER — Other Ambulatory Visit: Payer: Self-pay | Admitting: Nurse Practitioner

## 2022-11-24 DIAGNOSIS — Z760 Encounter for issue of repeat prescription: Secondary | ICD-10-CM

## 2022-11-27 ENCOUNTER — Other Ambulatory Visit: Payer: Self-pay | Admitting: Nurse Practitioner

## 2022-11-27 DIAGNOSIS — Z760 Encounter for issue of repeat prescription: Secondary | ICD-10-CM

## 2022-12-01 ENCOUNTER — Ambulatory Visit: Payer: BC Managed Care – PPO | Admitting: Dermatology

## 2022-12-08 ENCOUNTER — Ambulatory Visit: Payer: BC Managed Care – PPO | Admitting: Dermatology

## 2022-12-24 ENCOUNTER — Ambulatory Visit: Payer: BC Managed Care – PPO | Admitting: Dermatology

## 2022-12-24 VITALS — BP 108/70 | HR 70

## 2022-12-24 DIAGNOSIS — L814 Other melanin hyperpigmentation: Secondary | ICD-10-CM | POA: Diagnosis not present

## 2022-12-24 DIAGNOSIS — L559 Sunburn, unspecified: Secondary | ICD-10-CM

## 2022-12-24 DIAGNOSIS — Z7189 Other specified counseling: Secondary | ICD-10-CM

## 2022-12-24 DIAGNOSIS — L578 Other skin changes due to chronic exposure to nonionizing radiation: Secondary | ICD-10-CM

## 2022-12-24 DIAGNOSIS — S20462A Insect bite (nonvenomous) of left back wall of thorax, initial encounter: Secondary | ICD-10-CM

## 2022-12-24 DIAGNOSIS — L821 Other seborrheic keratosis: Secondary | ICD-10-CM

## 2022-12-24 DIAGNOSIS — W908XXA Exposure to other nonionizing radiation, initial encounter: Secondary | ICD-10-CM | POA: Diagnosis not present

## 2022-12-24 DIAGNOSIS — W57XXXA Bitten or stung by nonvenomous insect and other nonvenomous arthropods, initial encounter: Secondary | ICD-10-CM

## 2022-12-24 DIAGNOSIS — Z79899 Other long term (current) drug therapy: Secondary | ICD-10-CM

## 2022-12-24 DIAGNOSIS — Z1283 Encounter for screening for malignant neoplasm of skin: Secondary | ICD-10-CM

## 2022-12-24 DIAGNOSIS — D1801 Hemangioma of skin and subcutaneous tissue: Secondary | ICD-10-CM

## 2022-12-24 DIAGNOSIS — L219 Seborrheic dermatitis, unspecified: Secondary | ICD-10-CM

## 2022-12-24 DIAGNOSIS — D229 Melanocytic nevi, unspecified: Secondary | ICD-10-CM

## 2022-12-24 MED ORDER — DOXYCYCLINE MONOHYDRATE 100 MG PO TABS: ORAL_TABLET | ORAL | 0 refills | Status: DC

## 2022-12-24 NOTE — Progress Notes (Signed)
Follow-Up Visit   Subjective  Daylin Vogler is a 62 y.o. male who presents for the following: Skin Cancer Screening and Upper Body Skin Exam. Patient report he removed a tick from his back 4 days ago, no fevers, chills or sweats.  The patient presents for Upper Body Skin Exam (UBSE) for skin cancer screening and mole check. The patient has spots, moles and lesions to be evaluated, some may be new or changing and the patient may have concern these could be cancer.    The following portions of the chart were reviewed this encounter and updated as appropriate: medications, allergies, medical history  Review of Systems:  No other skin or systemic complaints except as noted in HPI or Assessment and Plan.  Objective  Well appearing patient in no apparent distress; mood and affect are within normal limits.  All skin waist up examined. Relevant physical exam findings are noted in the Assessment and Plan.    Assessment & Plan    Skin cancer screening performed today.  Actinic Damage - Chronic condition, secondary to cumulative UV/sun exposure - diffuse scaly erythematous macules with underlying dyspigmentation - Recommend daily broad spectrum sunscreen SPF 30+ to sun-exposed areas, reapply every 2 hours as needed.  - Staying in the shade or wearing long sleeves, sun glasses (UVA+UVB protection) and wide brim hats (4-inch brim around the entire circumference of the hat) are also recommended for sun protection.  - Call for new or changing lesions.  Lentigines, Seborrheic Keratoses, Hemangiomas - Benign normal skin lesions - Benign-appearing - Call for any changes  Melanocytic Nevi - Tan-brown and/or pink-flesh-colored symmetric macules and papules - Benign appearing on exam today - Observation - Call clinic for new or changing moles - Recommend daily use of broad spectrum spf 30+ sunscreen to sun-exposed areas.   BITE REACTION Left back- pink papule   Start Doxycycline 100 mg  take 1 tablet twice a day for 1 week, may take for 1 month if any symptoms occur due to history of tick bite.   Doxycycline should be taken with food to prevent nausea. Do not lay down for 30 minutes after taking. Be cautious with sun exposure and use good sun protection while on this medication. Pregnant women should not take this medication.     Seborrheic dermatitis Face and scalp Chronic and persistent condition with duration or expected duration over one year. Condition is symptomatic / bothersome to patient. Not to goal, but much improved.  Patient is pleased.   Seborrheic Dermatitis - much improved compared to previous visit compared to photos -  is a chronic persistent rash characterized by pinkness and scaling most commonly of the mid face but also can occur on the scalp (dandruff), ears; mid chest, mid back and groin.  It tends to be exacerbated by stress and cooler weather.  People who have neurologic disease may experience new onset or exacerbation of existing seborrheic dermatitis.  The condition is not curable but treatable and can be controlled.   Continue Ketoconazole 2% shampoo 3d/wk and Ketoconazole alternating with HC 2.5% cream QOD   SUN BURN  Nose- scaly, pink patch Recheck in 6 months patient was recently at the beach report he got this sun burn on his nose    Return in about 6 months (around 06/26/2023) for recheck  nose - sun burn.  Cipriano Bunker, CMA, am acting as scribe for Armida Sans, MD .   Documentation: I have reviewed the above documentation for accuracy and  completeness, and I agree with the above.  Armida Sans, MD

## 2022-12-24 NOTE — Patient Instructions (Signed)

## 2023-01-01 ENCOUNTER — Encounter: Payer: Self-pay | Admitting: Dermatology

## 2023-01-15 IMAGING — CT CT ABD-PELV W/ CM
2 of 5 series · 16 of 46 positions shown, 18 images · IV contrast (APPLIED)
Comparison: None.

CLINICAL DATA: Abdominal distension

EXAM:
CT ABDOMEN AND PELVIS WITH CONTRAST
TECHNIQUE: Multidetector CT imaging of the abdomen and pelvis was performed
using the standard protocol following bolus administration of
intravenous contrast.
CONTRAST:  80mL OMNIPAQUE IOHEXOL 350 MG/ML SOLN

[Series 2: routine abd/pel with · axial · 0.92mm/px · z∈[-866,-386]mm · 13 of 108 slices shown, 15 images]
[im 6/108  soft-tissue]
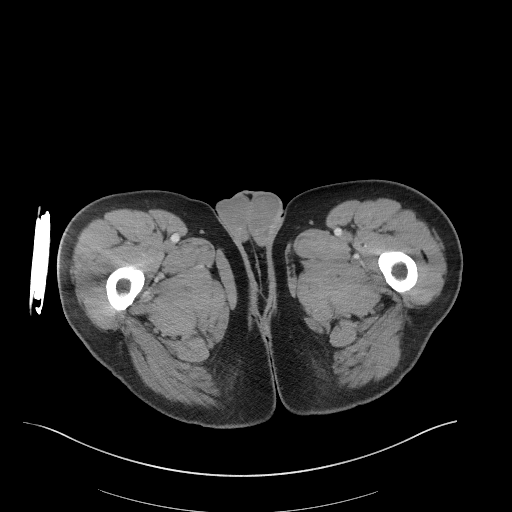
[im 6/108  bone]
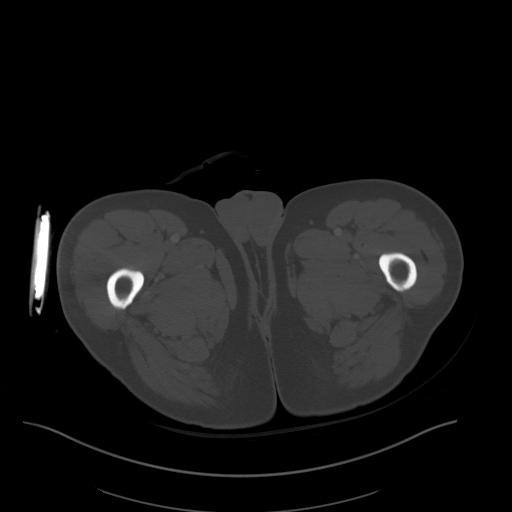
[im 17/108  soft-tissue]
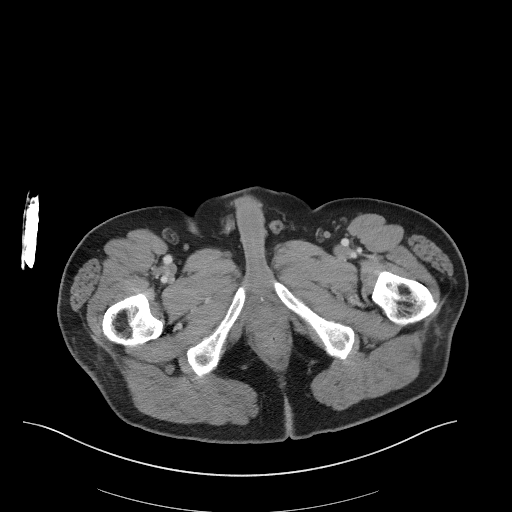
[im 22/108  soft-tissue]
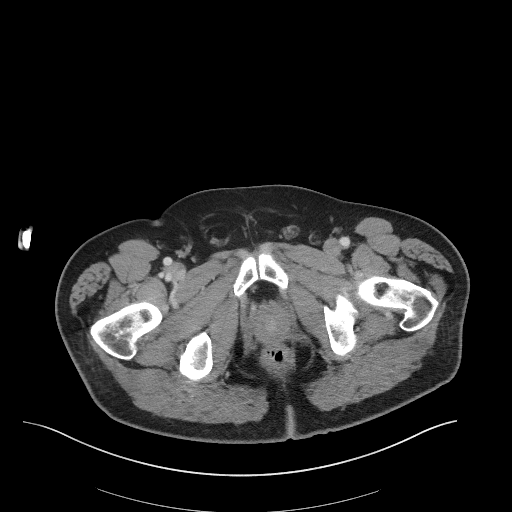
[im 33/108  soft-tissue]
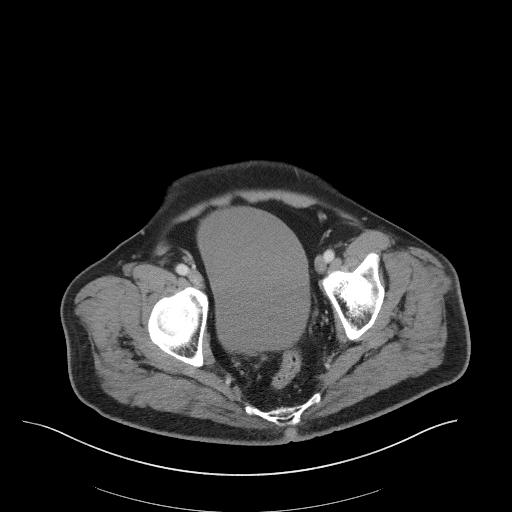
[im 38/108  soft-tissue]
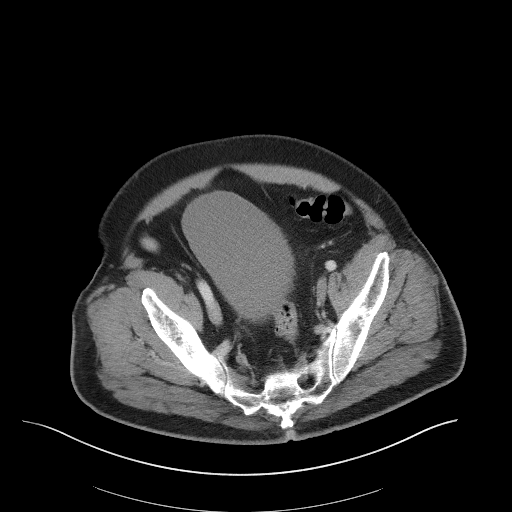
[im 49/108  soft-tissue]
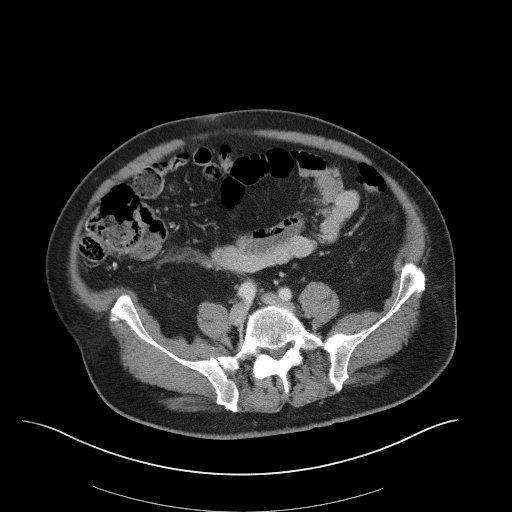
[im 54/108  soft-tissue]
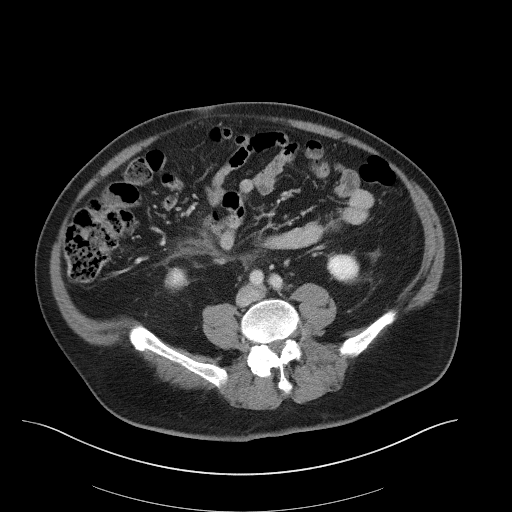
[im 59/108  soft-tissue]
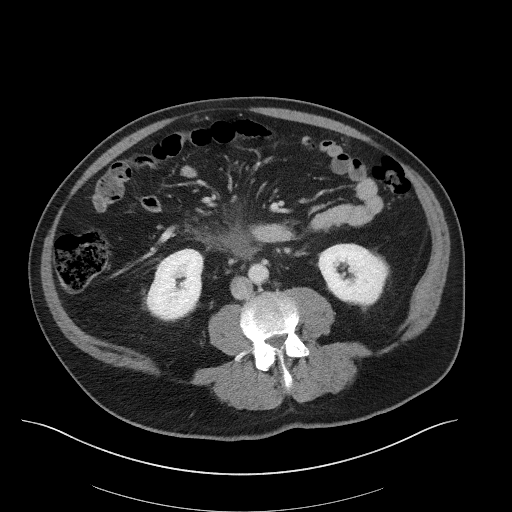
[im 70/108  soft-tissue]
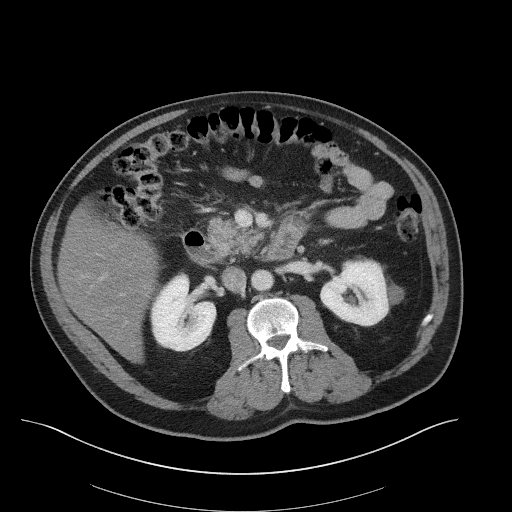
[im 70/108  bone]
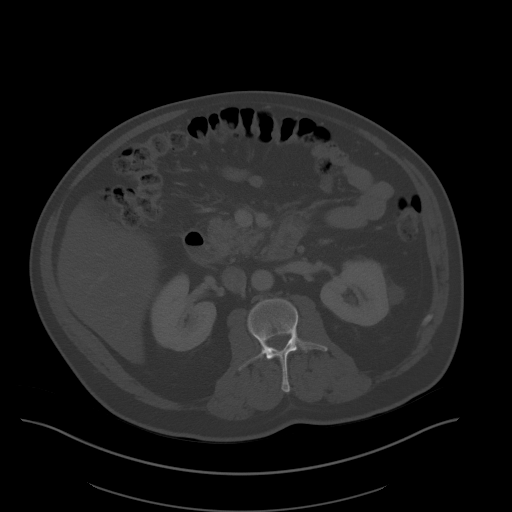
[im 75/108  soft-tissue]
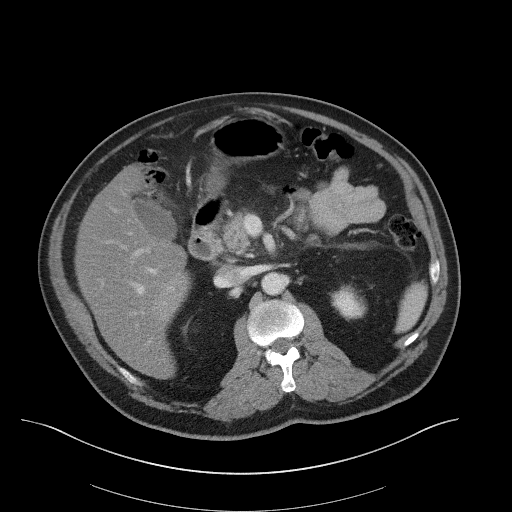
[im 86/108  soft-tissue]
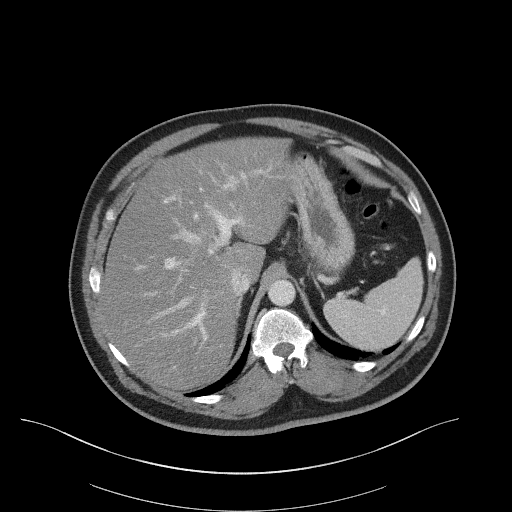
[im 91/108  soft-tissue]
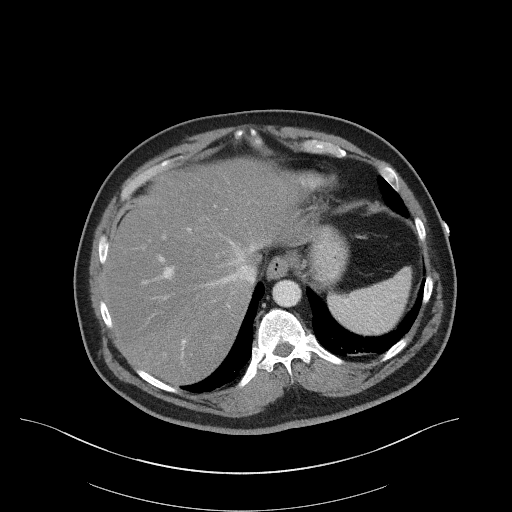
[im 102/108  soft-tissue]
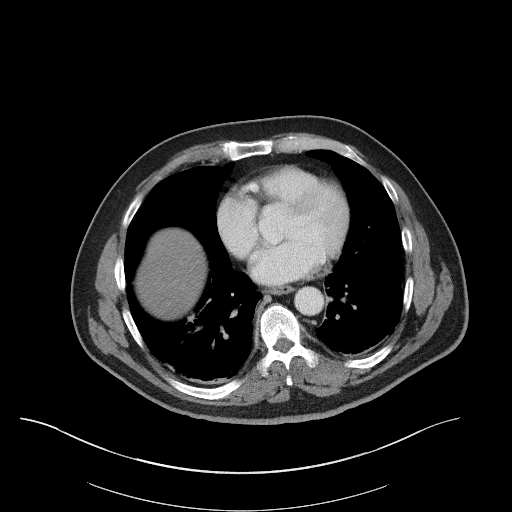

[Series 5: coronal st · coronal · 0.90mm/px · 3 of 111 slices shown]
[im 37/111  soft-tissue]
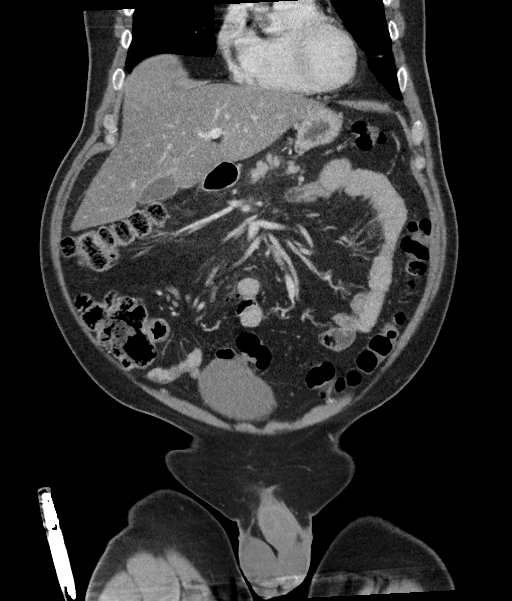
[im 49/111  soft-tissue]
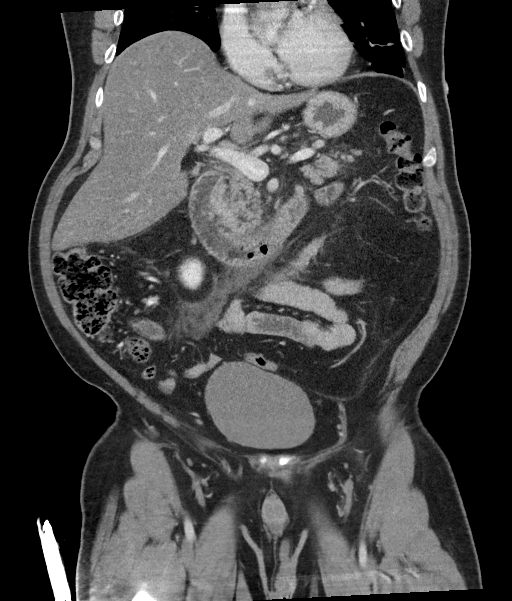
[im 62/111  soft-tissue]
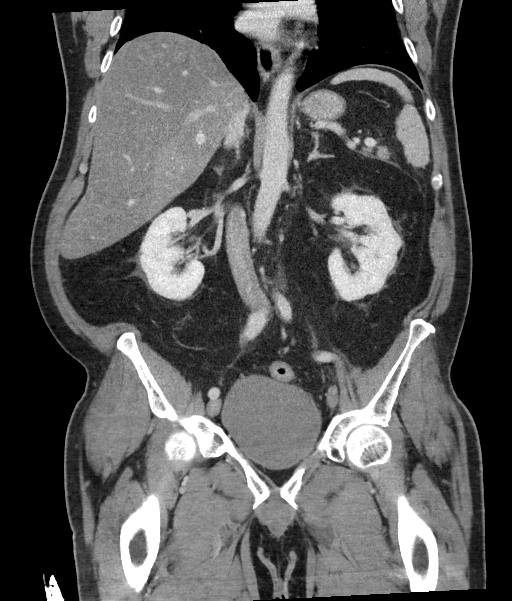

[16 of 46 positions shown; findings below may reference images not displayed]

FINDINGS: Lower chest: Bibasilar atelectasis

Hepatobiliary: Hepatic steatosis. Focal fatty sparing adjacent to
the gallbladder fossa. Gallbladder is unremarkable. No extrahepatic
biliary ductal dilation. Portal vein is patent.

Pancreas: There is fat stranding and edematous changes of the
pancreas. This is most particularly prevalent in the pancreatic head
and uncinate process. No evidence of pseudocyst formation or
pseudoaneurysm formation. Pancreas homogeneously enhances.

Spleen: Unremarkable.

Adrenals/Urinary Tract: Adrenal glands are unremarkable. Kidneys
enhance symmetrically. No hydronephrosis. No obstructing
nephrolithiasis. Multiple subcentimeter hypodense masses bilaterally
are too small to accurately characterize. Bladder is unremarkable.

Stomach/Bowel: Mild bowel wall thickening of the third portion of
the duodenum, likely reactive. No evidence of bowel obstruction.
Appendix is normal.

Vascular/Lymphatic: No significant vascular findings are present. No
enlarged abdominal or pelvic lymph nodes.

Reproductive: Prostate is present.

Other: Small fat containing RIGHT inguinal hernia.

Musculoskeletal: No acute or significant osseous findings.
IMPRESSION: 1. Constellation of findings are consistent with acute uncomplicated
interstitial pancreatitis. There is adjacent reactive duodenitis.
2. Hepatic steatosis.

## 2023-01-15 IMAGING — US US ABDOMEN LIMITED
1 series · 14 of 25 positions shown · non-contrast
Comparison: CT abdomen pelvis 12/30/2020

CLINICAL DATA: Right upper abdominal pain.

EXAM:
ULTRASOUND ABDOMEN LIMITED RIGHT UPPER QUADRANT

[Series 1: us abdomen limited ruq (liver/gb) · 14 of 39 slices shown]
[im 1/39]
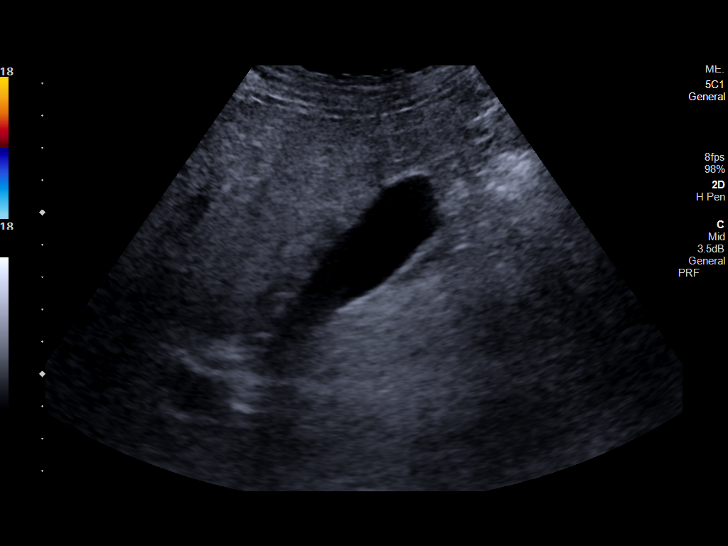
[im 4/39]
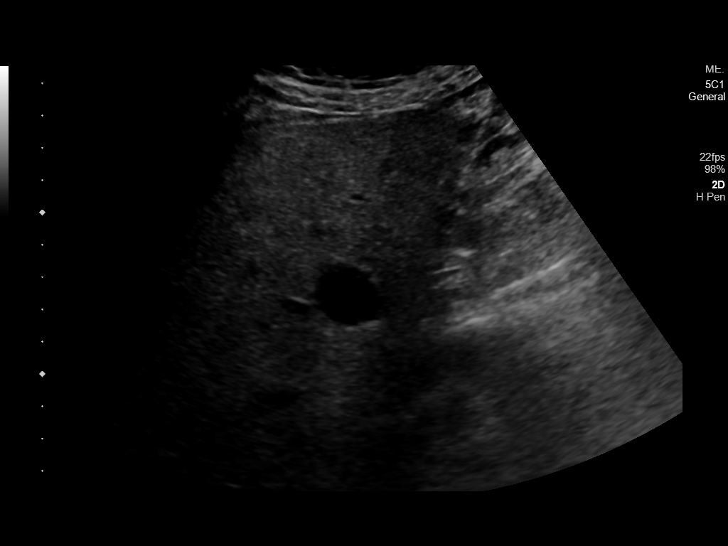
[im 7/39]
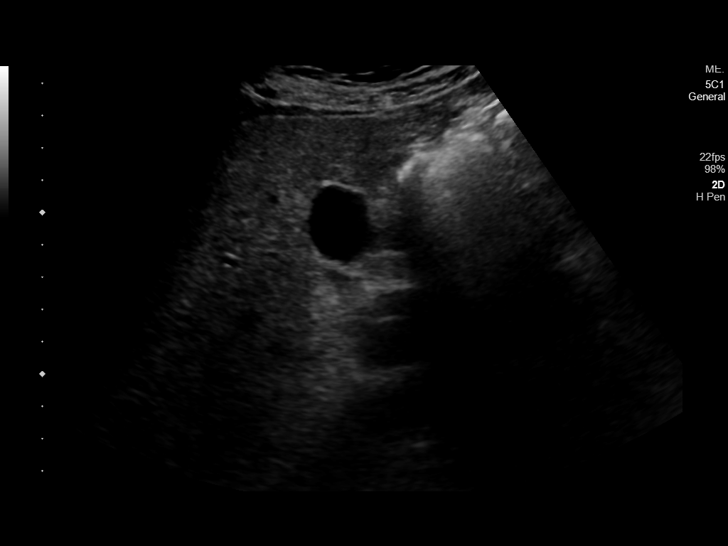
[im 10/39]
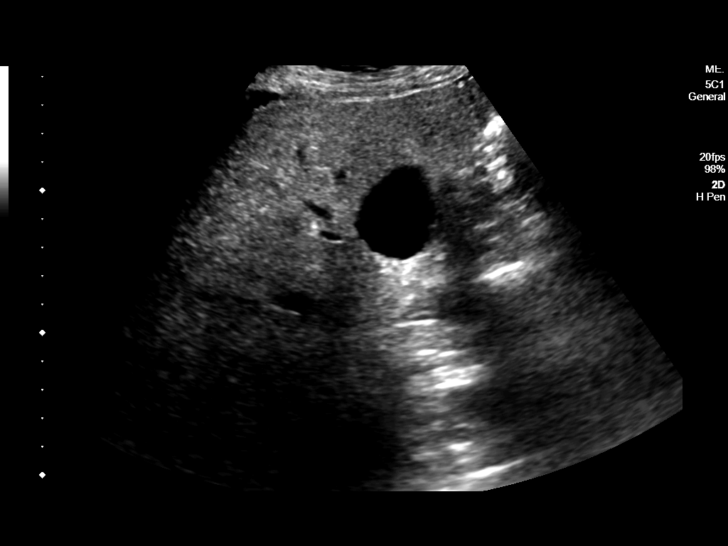
[im 13/39]
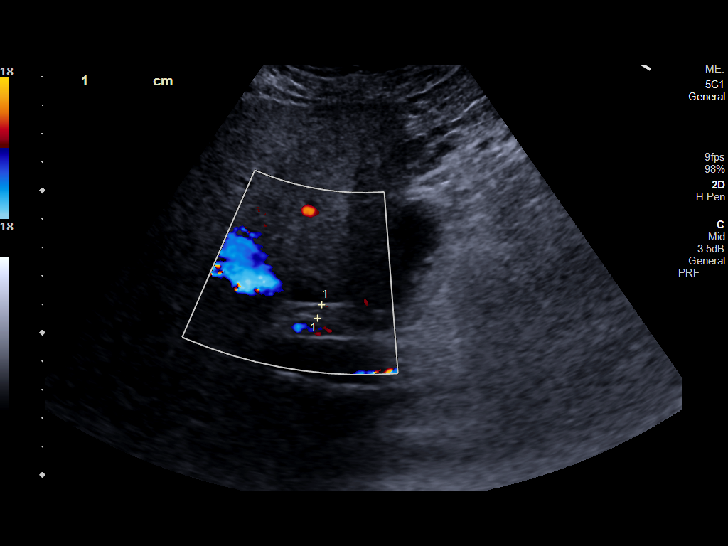
[im 15/39]
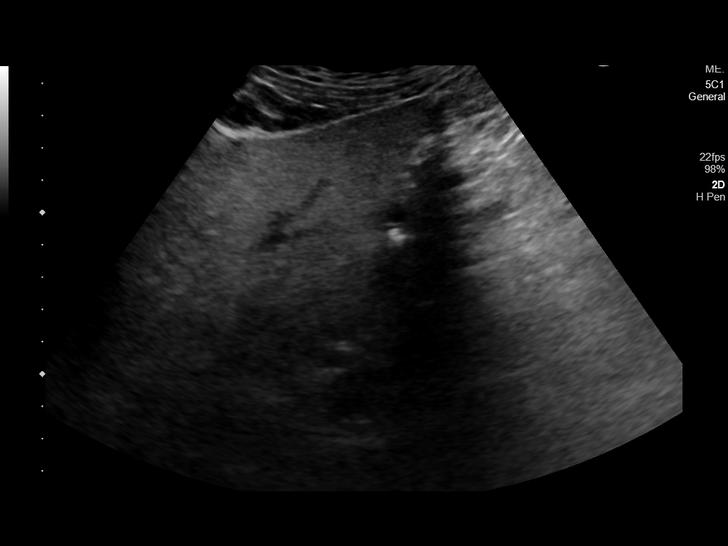
[im 18/39]
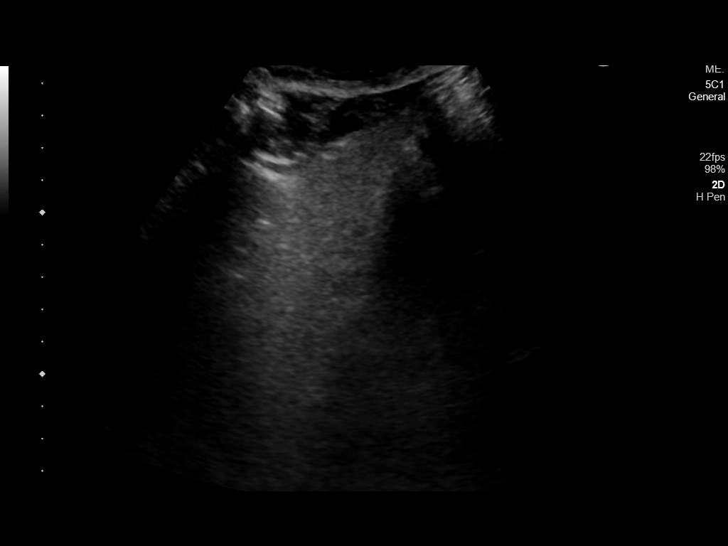
[im 21/39]
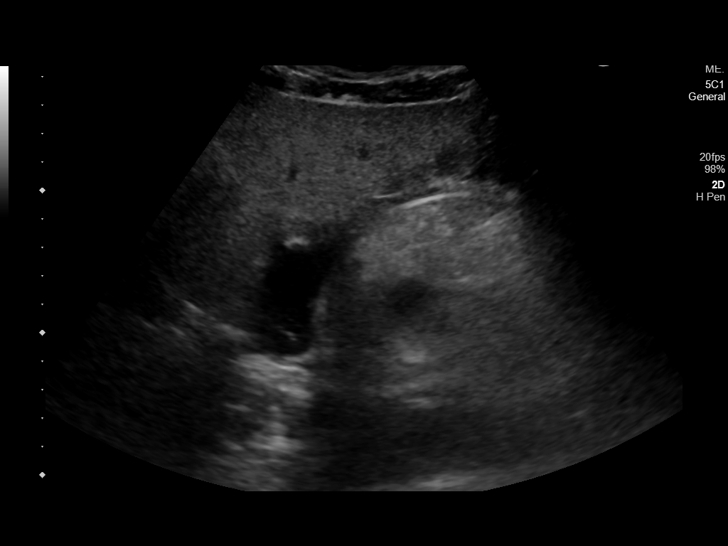
[im 24/39]
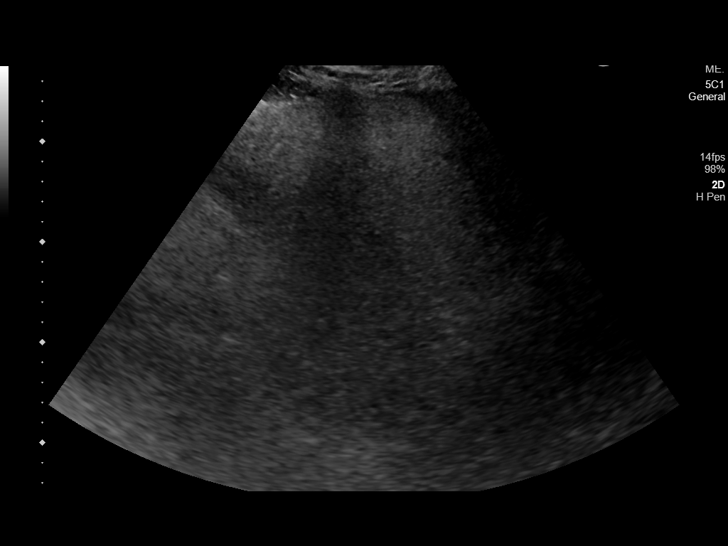
[im 26/39]
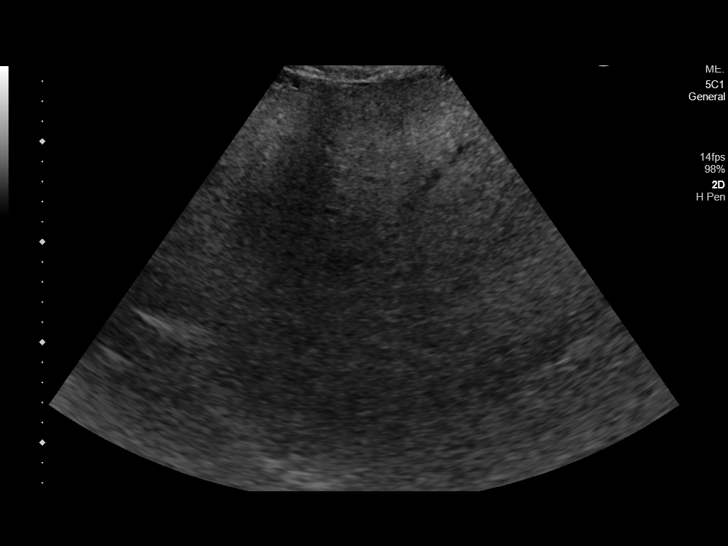
[im 29/39]
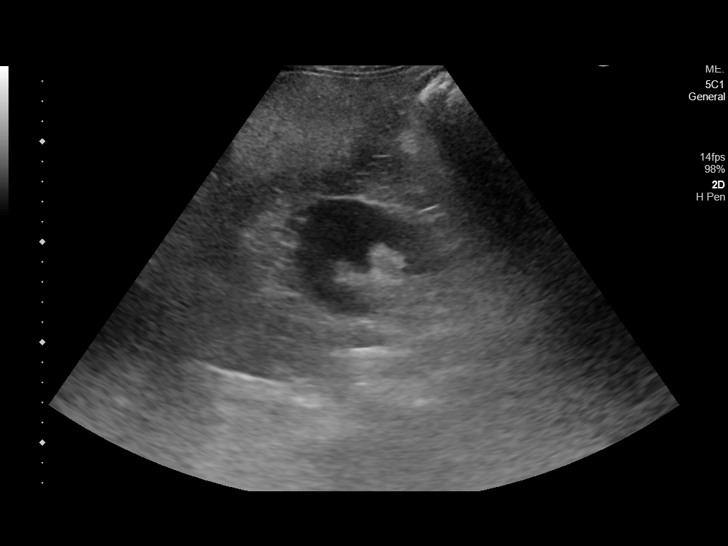
[im 32/39]
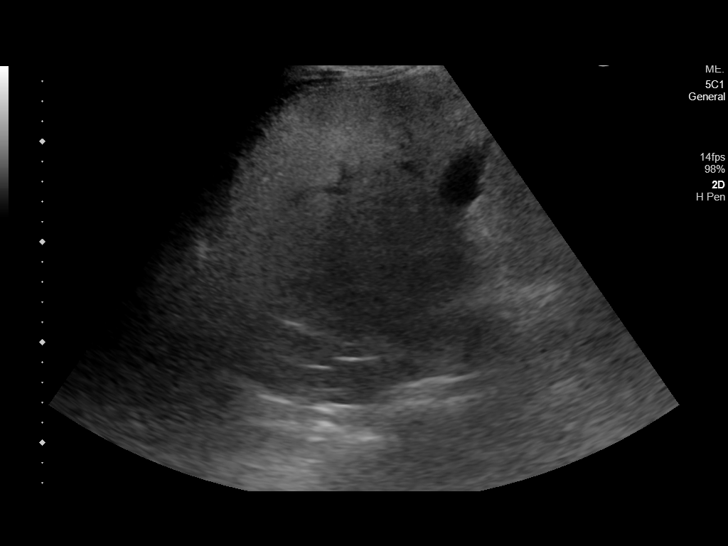
[im 35/39]
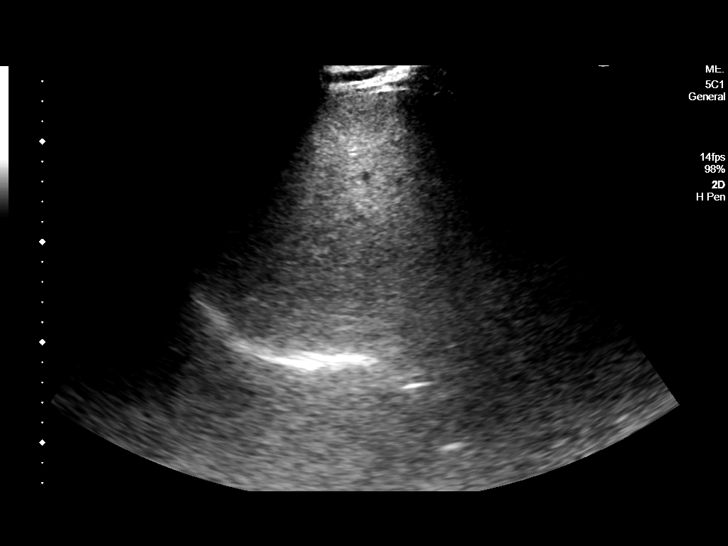
[im 39/39]
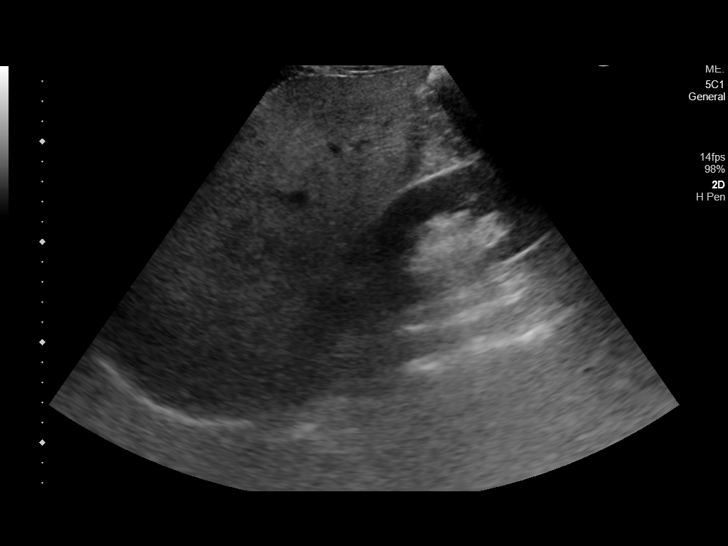

[14 of 25 positions shown; findings below may reference images not displayed]

FINDINGS: Gallbladder:

No gallstones or wall thickening visualized. No sonographic Murphy
sign noted by sonographer.

Common bile duct:

Diameter: 5 mm

Liver:

Enlarged in size. No focal lesion identified. Increased parenchymal
echogenicity. Portal vein is patent on color Doppler imaging with
normal direction of blood flow towards the liver.

Other: None.
IMPRESSION: Hepatomegaly and hepatic steatosis. Please note limited evaluation
for focal hepatic masses in a patient with hepatic steatosis due to
decreased penetration of the acoustic ultrasound waves.

## 2023-01-30 ENCOUNTER — Other Ambulatory Visit: Payer: Self-pay | Admitting: Nurse Practitioner

## 2023-01-30 DIAGNOSIS — E782 Mixed hyperlipidemia: Secondary | ICD-10-CM

## 2023-02-08 ENCOUNTER — Other Ambulatory Visit: Payer: Self-pay | Admitting: Dermatology

## 2023-02-08 DIAGNOSIS — L219 Seborrheic dermatitis, unspecified: Secondary | ICD-10-CM

## 2023-02-21 ENCOUNTER — Other Ambulatory Visit: Payer: Self-pay | Admitting: Nurse Practitioner

## 2023-02-21 DIAGNOSIS — Z760 Encounter for issue of repeat prescription: Secondary | ICD-10-CM

## 2023-03-08 ENCOUNTER — Other Ambulatory Visit: Payer: Self-pay | Admitting: Nurse Practitioner

## 2023-03-13 ENCOUNTER — Ambulatory Visit (INDEPENDENT_AMBULATORY_CARE_PROVIDER_SITE_OTHER): Payer: BC Managed Care – PPO | Admitting: Nurse Practitioner

## 2023-03-13 ENCOUNTER — Encounter: Payer: Self-pay | Admitting: Nurse Practitioner

## 2023-03-13 VITALS — BP 104/68 | HR 66 | Temp 98.4°F | Resp 16 | Ht 68.0 in | Wt 189.4 lb

## 2023-03-13 DIAGNOSIS — Z794 Long term (current) use of insulin: Secondary | ICD-10-CM

## 2023-03-13 DIAGNOSIS — Z0001 Encounter for general adult medical examination with abnormal findings: Secondary | ICD-10-CM | POA: Diagnosis not present

## 2023-03-13 DIAGNOSIS — E785 Hyperlipidemia, unspecified: Secondary | ICD-10-CM

## 2023-03-13 DIAGNOSIS — M150 Primary generalized (osteo)arthritis: Secondary | ICD-10-CM

## 2023-03-13 DIAGNOSIS — E559 Vitamin D deficiency, unspecified: Secondary | ICD-10-CM | POA: Diagnosis not present

## 2023-03-13 DIAGNOSIS — E1169 Type 2 diabetes mellitus with other specified complication: Secondary | ICD-10-CM

## 2023-03-13 MED ORDER — MELOXICAM 15 MG PO TABS
15.0000 mg | ORAL_TABLET | Freq: Every day | ORAL | 1 refills | Status: DC
Start: 2023-03-13 — End: 2023-05-18

## 2023-03-13 MED ORDER — TIRZEPATIDE 5 MG/0.5ML ~~LOC~~ SOAJ: 5.0000 mg | SUBCUTANEOUS | 3 refills | Status: DC

## 2023-03-13 MED ORDER — METFORMIN HCL ER 500 MG PO TB24
500.0000 mg | ORAL_TABLET | Freq: Two times a day (BID) | ORAL | 1 refills | Status: DC
Start: 2023-03-13 — End: 2023-07-14

## 2023-03-13 NOTE — Progress Notes (Signed)
[facility] [address] [place], [place] [address]  Internal MEDICINE  Office Visit Note  Patient Name: Tyler Powers  [id number]  [id number]  Date of Service: 03/13/2023  Chief Complaint  Patient presents with   Diabetes   Hyperlipidemia   Annual Exam   HPI Kanden presents for an annual well visit and physical exam.  Well-appearing 62 y.o. male with diabetes and hyperlipidemia.  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 Use up to four times daily as directed.   doxycycline (ADOXA) 100 MG tablet Take 1 tablet twice a day   hydrocortisone 2.5 % cream Apply topically to affected areas of face, scalp and ears 3 times weekly at bedtime. Tuesday Thursday Saturday   ibuprofen (ADVIL) 200 MG tablet Take 200 mg by mouth every 6 (six) hours as needed.   insulin glargine (LANTUS) 100 UNIT/ML Solostar Pen Inject 10 Units into the skin at bedtime.   Insulin Pen Needle (PEN NEEDLES) 31G X 5 MM MISC 10 Units by Does not apply route at bedtime.   ketoconazole (NIZORAL) 2 % cream Apply topically to affected areas of face, scalp, and ears at 3 times weekly at bedtime on Monday Wed and Friday   ketoconazole (NIZORAL) 2 % shampoo APPLY THREE TIMES PER WEEK, USE AS A BODY WASH AT SCALP, FACE, EARS, MASSAGE ONTO AFFECTED AREAS AND LEAVE FOR A FEW MINUTES IF CAN, THEN RINSE.   naproxen (NAPROSYN) 250 MG tablet Take by mouth.   omega-3 acid ethyl esters (LOVAZA) 1 g capsule TAKE 2 CAPSULES BY MOUTH TWICE A DAY   rosuvastatin (CRESTOR) 10 MG tablet TAKE 1 TABLET BY MOUTH EVERY DAY   Vitamin D,  Ergocalciferol, (DRISDOL) 1.25 MG (50000 UNIT) CAPS capsule TAKE 1 CAPSULE (50,000 UNITS TOTAL) BY MOUTH EVERY 7 (SEVEN) DAYS   [DISCONTINUED] meloxicam (MOBIC) 15 MG tablet TAKE 1 TABLET (15 MG TOTAL) BY MOUTH DAILY.   [DISCONTINUED] metFORMIN (GLUCOPHAGE-XR) 500 MG 24 hr tablet TAKE 1 TABLET (500 MG TOTAL) BY MOUTH IN THE MORNING AND AT BEDTIME   [DISCONTINUED] tirzepatide (MOUNJARO) 5 MG/0.5ML Pen Inject 5 mg into the skin once a week.   meloxicam (MOBIC) 15 MG tablet Take 1 tablet (15 mg total) by mouth daily.   metFORMIN (GLUCOPHAGE-XR) 500 MG 24 hr tablet Take 1 tablet (500 mg total) by mouth in the morning and at bedtime.   tirzepatide [facility]) 5 MG/0.5ML Pen Inject 5 mg into the skin once a week.   No facility-administered encounter medications on file as of 03/13/2023.    Surgical History: Past Surgical History:  Procedure Laterality Date   screws in left leg Left     Medical History: Past Medical History:  Diagnosis Date   Diabetes mellitus without complication (HCC)    Hyperlipidemia     Family History: Family History  Problem Relation Age of Onset   Cancer Mother    COPD Father    Asthma Father    Asthma Brother    Diabetes Maternal Aunt    Varicose Veins Maternal Grandmother     Social History  Socioeconomic History   Marital status: Single    Spouse name: Not on file   Number of children: Not on file   Years of education: Not on file   Highest education level: Not on file  Occupational History   Not on file  Tobacco Use   Smoking status: Former    Types: Cigarettes   Smokeless tobacco: Never  Substance and Sexual Activity   Alcohol use: Never   Drug use: Never   Sexual activity: Not on file  Other Topics Concern   Not on file  Social History Narrative   Not on file   Social Determinants of Health   Financial Resource Strain: Not on file  Food Insecurity: Not on file  Transportation Needs: Not on file  Physical Activity: Not on file   Stress: Not on file  Social Connections: Not on file  Intimate Partner Violence: Not on file      Review of Systems  Vital Signs: BP 104/68   Pulse 66   Temp 98.4 F (36.9 C)   Resp 16   Ht 5\' 8"  (1.727 m)   Wt 189 lb 6.4 oz (85.9 kg)   SpO2 95%   BMI 28.80 kg/m    Physical Exam Vitals reviewed.  Constitutional:      General: He is awake. He is not in acute distress.    Appearance: Normal appearance. He is well-developed, well-groomed and overweight. He is not ill-appearing or diaphoretic.  HENT:     Head: Normocephalic and atraumatic.     Right Ear: Tympanic membrane, ear canal and external ear normal.     Left Ear: Tympanic membrane, ear canal and external ear normal.     Nose: Nose normal. No congestion or rhinorrhea.     Mouth/Throat:     Lips: Pink.     Mouth: Mucous membranes are moist.     Pharynx: Oropharynx is clear. Uvula midline. No oropharyngeal exudate.  Eyes:     General: Lids are normal. Vision grossly intact. Gaze aligned appropriately. No scleral icterus.       Right eye: No discharge.        Left eye: No discharge.     Extraocular Movements: Extraocular movements intact.     Conjunctiva/sclera: Conjunctivae normal.     Pupils: Pupils are equal, round, and reactive to light.     Funduscopic exam:    Right eye: Red reflex present.        Left eye: Red reflex present. Neck:     Thyroid: No thyromegaly.     Vascular: No JVD.     Trachea: Trachea and phonation normal. No tracheal deviation.  Cardiovascular:     Rate and Rhythm: Normal rate and regular rhythm.     Pulses: Normal pulses.          Dorsalis pedis pulses are 2+ on the right side and 2+ on the left side.       Posterior tibial pulses are 2+ on the right side and 2+ on the left side.     Heart sounds: Normal heart sounds, S1 normal and S2 normal. No murmur heard.    No friction rub. No gallop.  Pulmonary:     Effort: Pulmonary effort is normal. No accessory muscle usage or respiratory  distress.     Breath sounds: Normal breath sounds and air entry. No stridor. No wheezing or rales.  Chest:     Chest wall: No tenderness.  Abdominal:     General:  Bowel sounds are normal. There is no distension.     Palpations: Abdomen is soft. There is no shifting dullness, fluid wave, mass or pulsatile mass.     Tenderness: There is no abdominal tenderness. There is no guarding or rebound.  Musculoskeletal:        General: No tenderness or deformity.     Cervical back: Normal range of motion and neck supple.     Right lower leg: No edema.     Left lower leg: No edema.     Right foot: Normal range of motion. No deformity, bunion, Charcot foot, foot drop or prominent metatarsal heads.     Left foot: Decreased range of motion. No deformity, bunion, Charcot foot, foot drop or prominent metatarsal heads.  Feet:     Right foot:     Protective Sensation: 6 sites tested.  6 sites sensed.     Skin integrity: Dry skin present. No ulcer, blister, skin breakdown, erythema, warmth, callus or fissure.     Toenail Condition: Right toenails are long.     Left foot:     Protective Sensation: 6 sites tested.  6 sites sensed.     Skin integrity: Callus and dry skin present. No ulcer, blister, skin breakdown, erythema, warmth or fissure.     Toenail Condition: Left toenails are abnormally thick and long.  Lymphadenopathy:     Cervical: No cervical adenopathy.  Skin:    General: Skin is warm and dry.     Capillary Refill: Capillary refill takes less than 2 seconds.     Coloration: Skin is not pale.     Findings: No erythema or rash.  Neurological:     Mental Status: He is alert and oriented to person, place, and time.     Cranial Nerves: No cranial nerve deficit.     Motor: No abnormal muscle tone.     Coordination: Coordination normal.     Gait: Gait normal.     Deep Tendon Reflexes: Reflexes are normal and symmetric.  Psychiatric:        Mood and Affect: Mood and affect normal.         Behavior: Behavior normal. Behavior is cooperative.        Thought Content: Thought content normal.        Judgment: Judgment normal.        Assessment/Plan: 1. Encounter for routine adult health examination with abnormal findings Age-appropriate preventive screenings and vaccinations discussed, annual physical exam completed. Routine labs for health maintenance will be ordered for patient to have drawn soon. PHM updated.    2. Type 2 diabetes mellitus with other specified complication, with long-term current use of insulin (HCC) Continue mounjaro and metformin as prescribed.  - metFORMIN (GLUCOPHAGE-XR) 500 MG 24 hr tablet; Take 1 tablet (500 mg total) by mouth in the morning and at bedtime.  Dispense: 180 tablet; Refill: 1 - tirzepatide (MOUNJARO) 5 MG/0.5ML Pen; Inject 5 mg into the skin once a week.  Dispense: 6 mL; Refill: 3  3. Hyperlipidemia associated with type 2 diabetes mellitus (HCC) Continue rosuvastatin as prescribed.   4. Primary osteoarthritis involving multiple joints Continue meloxicam as prescribed.  - meloxicam (MOBIC) 15 MG tablet; Take 1 tablet (15 mg total) by mouth daily.  Dispense: 90 tablet; Refill: 1  5. Vitamin D deficiency Will repeat lab, may take OTC vitamin D supplement for now      General Counseling: Freida Busman verbalizes understanding of the findings of todays visit and agrees  with plan of treatment. I have discussed any further diagnostic evaluation that may be needed or ordered today. We also reviewed his medications today. he has been encouraged to call the office with any questions or concerns that should arise related to todays visit.    No orders of the defined types were placed in this encounter.   Meds ordered this encounter  Medications   meloxicam (MOBIC) 15 MG tablet    Sig: Take 1 tablet (15 mg total) by mouth daily.    Dispense:  90 tablet    Refill:  1    For future refills, keep on file   metFORMIN (GLUCOPHAGE-XR) 500 MG 24 hr  tablet    Sig: Take 1 tablet (500 mg total) by mouth in the morning and at bedtime.    Dispense:  180 tablet    Refill:  1    For future refills, keep on file   tirzepatide [facility]) 5 MG/0.5ML Pen    Sig: Inject 5 mg into the skin once a week.    Dispense:  6 mL    Refill:  3    For future refills, keep on file, 90 day supply for each fill please    Return in about 4 months (around 07/11/2023) for F/U, Recheck A1C, Glorine Hanratty PCP.   Total time spent:30 Minutes Time spent includes review of chart, medications, test results, and follow up plan with the patient.   [place] Controlled Substance Database was reviewed by me.  This patient was seen by Sallyanne Kuster, FNP-C in collaboration with Dr. Beverely Risen as a part of collaborative care agreement.  Kevyn Boquet R. Tedd Sias, MSN, FNP-C Internal medicine

## 2023-03-17 ENCOUNTER — Other Ambulatory Visit: Payer: Self-pay | Admitting: Nurse Practitioner

## 2023-03-19 LAB — CBC WITH DIFFERENTIAL/PLATELET
Basophils Absolute: 0.1 10*3/uL (ref 0.0–0.2)
Basos: 1 %
EOS (ABSOLUTE): 0.2 10*3/uL (ref 0.0–0.4)
Eos: 2 %
Hematocrit: 51.2 % — ABNORMAL HIGH (ref 37.5–51.0)
Hemoglobin: 16.6 g/dL (ref 13.0–17.7)
Immature Grans (Abs): 0 10*3/uL (ref 0.0–0.1)
Immature Granulocytes: 0 %
Lymphocytes Absolute: 2.9 10*3/uL (ref 0.7–3.1)
Lymphs: 31 %
MCH: 31.2 pg (ref 26.6–33.0)
MCHC: 32.4 g/dL (ref 31.5–35.7)
MCV: 96 fL (ref 79–97)
Monocytes Absolute: 0.5 10*3/uL (ref 0.1–0.9)
Monocytes: 5 %
Neutrophils Absolute: 5.8 10*3/uL (ref 1.4–7.0)
Neutrophils: 61 %
Platelets: 223 10*3/uL (ref 150–450)
RBC: 5.32 x10E6/uL (ref 4.14–5.80)
RDW: 12.4 % (ref 11.6–15.4)
WBC: 9.5 10*3/uL (ref 3.4–10.8)

## 2023-03-19 LAB — COMPREHENSIVE METABOLIC PANEL
ALT: 24 [IU]/L (ref 0–44)
AST: 24 [IU]/L (ref 0–40)
Albumin: 4.9 g/dL (ref 3.8–4.9)
Alkaline Phosphatase: 75 [IU]/L (ref 44–121)
BUN/Creatinine Ratio: 30 — ABNORMAL HIGH (ref 10–24)
BUN: 24 mg/dL (ref 8–27)
Bilirubin Total: 0.5 mg/dL (ref 0.0–1.2)
CO2: 26 mmol/L (ref 20–29)
Calcium: 10.2 mg/dL (ref 8.6–10.2)
Chloride: 99 mmol/L (ref 96–106)
Creatinine, Ser: 0.8 mg/dL (ref 0.76–1.27)
Globulin, Total: 2.5 g/dL (ref 1.5–4.5)
Glucose: 108 mg/dL — ABNORMAL HIGH (ref 70–99)
Potassium: 4.8 mmol/L (ref 3.5–5.2)
Sodium: 138 mmol/L (ref 134–144)
Total Protein: 7.4 g/dL (ref 6.0–8.5)
eGFR: 101 mL/min/{1.73_m2} (ref >59)

## 2023-03-19 LAB — PSA: Prostate Specific Ag, Serum: 0.5 ng/mL (ref 0.0–4.0)

## 2023-03-19 LAB — MICROALBUMIN / CREATININE URINE RATIO
Creatinine, Urine: 75.6 mg/dL
Microalb/Creat Ratio: 8 mg/g{creat} (ref 0–29)
Microalbumin, Urine: 6 ug/mL

## 2023-03-19 LAB — VITAMIN D 25 HYDROXY (VIT D DEFICIENCY, FRACTURES): Vit D, 25-Hydroxy: 73.8 ng/mL (ref 30.0–100.0)

## 2023-03-19 LAB — LIPID PANEL
Chol/HDL Ratio: 2.5 ratio (ref 0.0–5.0)
Cholesterol, Total: 120 mg/dL (ref 100–199)
HDL: 48 mg/dL (ref >39)
LDL Chol Calc (NIH): 50 mg/dL (ref 0–99)
Triglycerides: 124 mg/dL (ref 0–149)
VLDL Cholesterol Cal: 22 mg/dL (ref 5–40)

## 2023-03-19 LAB — HGB A1C W/O EAG: Hgb A1c MFr Bld: 6.4 % — ABNORMAL HIGH (ref 4.8–5.6)

## 2023-05-03 ENCOUNTER — Encounter: Payer: Self-pay | Admitting: Nurse Practitioner

## 2023-05-03 ENCOUNTER — Other Ambulatory Visit: Payer: Self-pay | Admitting: Nurse Practitioner

## 2023-05-03 DIAGNOSIS — E559 Vitamin D deficiency, unspecified: Secondary | ICD-10-CM

## 2023-05-18 ENCOUNTER — Other Ambulatory Visit: Payer: Self-pay

## 2023-05-18 DIAGNOSIS — E1169 Type 2 diabetes mellitus with other specified complication: Secondary | ICD-10-CM

## 2023-05-18 MED ORDER — MELOXICAM 15 MG PO TABS: 15.0000 mg | ORAL_TABLET | Freq: Every day | ORAL | 1 refills | Status: DC

## 2023-05-23 ENCOUNTER — Encounter: Payer: Self-pay | Admitting: Nurse Practitioner

## 2023-07-01 ENCOUNTER — Ambulatory Visit: Payer: BC Managed Care – PPO | Admitting: Dermatology

## 2023-07-14 ENCOUNTER — Encounter: Payer: Self-pay | Admitting: Nurse Practitioner

## 2023-07-14 ENCOUNTER — Ambulatory Visit (INDEPENDENT_AMBULATORY_CARE_PROVIDER_SITE_OTHER): Payer: BC Managed Care – PPO | Admitting: Nurse Practitioner

## 2023-07-14 VITALS — BP 110/78 | HR 74 | Temp 98.6°F | Resp 16 | Ht 68.0 in | Wt 192.8 lb

## 2023-07-14 DIAGNOSIS — E785 Hyperlipidemia, unspecified: Secondary | ICD-10-CM

## 2023-07-14 DIAGNOSIS — E559 Vitamin D deficiency, unspecified: Secondary | ICD-10-CM | POA: Diagnosis not present

## 2023-07-14 DIAGNOSIS — E1169 Type 2 diabetes mellitus with other specified complication: Secondary | ICD-10-CM

## 2023-07-14 DIAGNOSIS — Z794 Long term (current) use of insulin: Secondary | ICD-10-CM | POA: Diagnosis not present

## 2023-07-14 LAB — POCT GLYCOSYLATED HEMOGLOBIN (HGB A1C): Hemoglobin A1C: 6.2 % — AB (ref 4.0–5.6)

## 2023-07-14 MED ORDER — ROSUVASTATIN CALCIUM 10 MG PO TABS: 10.0000 mg | ORAL_TABLET | Freq: Every day | ORAL | 1 refills | Status: DC

## 2023-07-14 MED ORDER — METFORMIN HCL ER 500 MG PO TB24: 500.0000 mg | ORAL_TABLET | Freq: Two times a day (BID) | ORAL | 1 refills | Status: DC

## 2023-07-14 MED ORDER — MOUNJARO 7.5 MG/0.5ML ~~LOC~~ SOAJ: 7.5000 mg | SUBCUTANEOUS | 3 refills | Status: DC

## 2023-07-14 MED ORDER — OMEGA-3-ACID ETHYL ESTERS 1 G PO CAPS: ORAL_CAPSULE | ORAL | 1 refills | Status: DC

## 2023-07-14 NOTE — Progress Notes (Signed)
 [facility] [address] [place], [place] [address]  Internal MEDICINE  Office Visit Note  Patient Name: Tyler Powers  [id number]  [id number]  Date of Service: 07/14/2023  Chief Complaint  Patient presents with   Hyperlipidemia   Diabetes   Follow-up    HPI Lang presents for a follow-up visit for diabetes, high cholesterol and low vitamin D Diabetes -- A1c improved to 6.2. insurance is requiring him to switch to semglee but he has enough lantus for a few months, will call clinic when he needs the script written. Was being charged a high amount for insulin pen needles but found them for a low price on [place].  High cholesterol -- taking rosuvastatin and lovaza. Due for refills. Insurance is now requiring PA for lovaza.  Low vitamin D -- was improved on labs, switched to OTC supplement.     Current Medication: Outpatient Encounter Medications as of 07/14/2023  Medication Sig Note   blood glucose meter kit and supplies KIT Dispense based on patient and insurance preference. Use up to four times daily as directed.    doxycycline (ADOXA) 100 MG tablet Take 1 tablet twice a day    hydrocortisone 2.5 % cream Apply topically to affected areas of face, scalp and ears 3 times weekly at bedtime. Tuesday Thursday Saturday    ibuprofen (ADVIL) 200 MG tablet Take 200 mg by mouth every 6 (six) hours as needed.    insulin glargine (LANTUS) 100 UNIT/ML Solostar Pen Inject 10 Units into the skin at bedtime.    Insulin Pen Needle (PEN NEEDLES) 31G X 5 MM MISC 10 Units by Does not apply route at bedtime. 07/14/2023: Gets pen needles via [email or web address]   ketoconazole (NIZORAL) 2 % cream Apply topically to affected areas of face, scalp, and ears at 3 times weekly at bedtime on Monday Wed and Friday    ketoconazole (NIZORAL) 2 % shampoo APPLY THREE TIMES PER WEEK, USE AS A BODY WASH AT SCALP, FACE, EARS, MASSAGE ONTO AFFECTED AREAS AND LEAVE FOR A FEW MINUTES IF CAN, THEN RINSE.    meloxicam  (MOBIC) 15 MG tablet Take 1 tablet (15 mg total) by mouth daily.    naproxen (NAPROSYN) 250 MG tablet Take by mouth.    tirzepatide (MOUNJARO) 7.5 MG/0.5ML Pen Inject 7.5 mg into the skin once a week.    [DISCONTINUED] metFORMIN (GLUCOPHAGE-XR) 500 MG 24 hr tablet Take 1 tablet (500 mg total) by mouth in the morning and at bedtime.    [DISCONTINUED] omega-3 acid ethyl esters (LOVAZA) 1 g capsule TAKE 2 CAPSULES BY MOUTH TWICE A DAY    [DISCONTINUED] rosuvastatin (CRESTOR) 10 MG tablet TAKE 1 TABLET BY MOUTH EVERY DAY    [DISCONTINUED] tirzepatide (MOUNJARO) 5 MG/0.5ML Pen Inject 5 mg into the skin once a week.    [DISCONTINUED] Vitamin D, Ergocalciferol, (DRISDOL) 1.25 MG (50000 UNIT) CAPS capsule TAKE 1 CAPSULE (50,000 UNITS TOTAL) BY MOUTH EVERY 7 (SEVEN) DAYS    metFORMIN (GLUCOPHAGE-XR) 500 MG 24 hr tablet Take 1 tablet (500 mg total) by mouth in the morning and at bedtime.    omega-3 acid ethyl esters (LOVAZA) 1 g capsule TAKE 2 CAPSULES BY MOUTH TWICE A DAY    rosuvastatin (CRESTOR) 10 MG tablet Take 1 tablet (10 mg total) by mouth daily.    No facility-administered encounter medications on file as of 07/14/2023.    Surgical History: Past Surgical History:  Procedure Laterality Date   screws in left leg Left  Medical History: Past Medical History:  Diagnosis Date   Diabetes mellitus without complication (HCC)    Hyperlipidemia     Family History: Family History  Problem Relation Age of Onset   Cancer Mother    COPD Father    Asthma Father    Asthma Brother    Diabetes Maternal Aunt    Varicose Veins Maternal Grandmother     Social History   Socioeconomic History   Marital status: Single    Spouse name: Not on file   Number of children: Not on file   Years of education: Not on file   Highest education level: Not on file  Occupational History   Not on file  Tobacco Use   Smoking status: Former    Types: Cigarettes   Smokeless tobacco: Never  Substance and  Sexual Activity   Alcohol use: Never   Drug use: Never   Sexual activity: Not on file  Other Topics Concern   Not on file  Social History Narrative   Not on file   Social Drivers of Health   Financial Resource Strain: Not on file  Food Insecurity: Not on file  Transportation Needs: Not on file  Physical Activity: Not on file  Stress: Not on file  Social Connections: Not on file  Intimate Partner Violence: Not on file      Review of Systems  Constitutional:  Negative for chills, fatigue and unexpected weight change.  HENT:  Negative for congestion, rhinorrhea, sneezing and sore throat.   Eyes:  Negative for redness.  Respiratory:  Negative for cough, chest tightness and shortness of breath.   Cardiovascular:  Negative for chest pain and palpitations.  Gastrointestinal:  Negative for abdominal pain, constipation, diarrhea, nausea and vomiting.  Genitourinary:  Negative for dysuria and frequency.  Musculoskeletal:  Negative for arthralgias, back pain, joint swelling and neck pain.  Skin:  Negative for rash.  Neurological: Negative.  Negative for tremors and numbness.  Hematological:  Negative for adenopathy. Does not bruise/bleed easily.  Psychiatric/Behavioral:  Negative for behavioral problems (Depression), sleep disturbance and suicidal ideas. The patient is not nervous/anxious.     Vital Signs: BP 110/78   Pulse 74   Temp 98.6 F (37 C)   Resp 16   Ht 5\' 8"  (1.727 m)   Wt 192 lb 12.8 oz (87.5 kg)   SpO2 96%   BMI 29.32 kg/m    Physical Exam Vitals reviewed.  Constitutional:      General: He is not in acute distress.    Appearance: Normal appearance. He is obese. He is not ill-appearing.  HENT:     Head: Normocephalic and atraumatic.  Eyes:     Pupils: Pupils are equal, round, and reactive to light.  Cardiovascular:     Rate and Rhythm: Normal rate and regular rhythm.  Pulmonary:     Effort: Pulmonary effort is normal. No respiratory distress.   Neurological:     Mental Status: He is alert and oriented to person, place, and time.  Psychiatric:        Mood and Affect: Mood normal.        Behavior: Behavior normal.        Assessment/Plan: 1. Type 2 diabetes mellitus with other specified complication, with long-term current use of insulin (HCC) (Primary) Stable, A1c slightly improved. Mounjaro dose incresaed to 7.5 weekly. Continue metformin XR as prescribed.  - POCT glycosylated hemoglobin (Hb A1C) - tirzepatide (MOUNJARO) 7.5 MG/0.5ML Pen; Inject 7.5 mg into the  skin once a week.  Dispense: 6 mL; Refill: 3 - metFORMIN (GLUCOPHAGE-XR) 500 MG 24 hr tablet; Take 1 tablet (500 mg total) by mouth in the morning and at bedtime.  Dispense: 180 tablet; Refill: 1  2. Hyperlipidemia associated with type 2 diabetes mellitus (HCC) Continue rosuvastatin and lovaza as prescribed.  - rosuvastatin (CRESTOR) 10 MG tablet; Take 1 tablet (10 mg total) by mouth daily.  Dispense: 90 tablet; Refill: 1 - omega-3 acid ethyl esters (LOVAZA) 1 g capsule; TAKE 2 CAPSULES BY MOUTH TWICE A DAY  Dispense: 360 capsule; Refill: 1  3. Vitamin D deficiency Taking OCT supplement    General Counseling: Kenyatta verbalizes understanding of the findings of todays visit and agrees with plan of treatment. I have discussed any further diagnostic evaluation that may be needed or ordered today. We also reviewed his medications today. he has been encouraged to call the office with any questions or concerns that should arise related to todays visit.    Orders Placed This Encounter  Procedures   POCT glycosylated hemoglobin (Hb A1C)    Meds ordered this encounter  Medications   tirzepatide (MOUNJARO) 7.5 MG/0.5ML Pen    Sig: Inject 7.5 mg into the skin once a week.    Dispense:  6 mL    Refill:  3    Fill new script today, note increased dose to 7.5 mg. Discontinue 5 mg dose.   rosuvastatin (CRESTOR) 10 MG tablet    Sig: Take 1 tablet (10 mg total) by mouth  daily.    Dispense:  90 tablet    Refill:  1   omega-3 acid ethyl esters (LOVAZA) 1 g capsule    Sig: TAKE 2 CAPSULES BY MOUTH TWICE A DAY    Dispense:  360 capsule    Refill:  1   metFORMIN (GLUCOPHAGE-XR) 500 MG 24 hr tablet    Sig: Take 1 tablet (500 mg total) by mouth in the morning and at bedtime.    Dispense:  180 tablet    Refill:  1    For future refills, keep on file    Return in about 5 months (around 12/14/2023) for F/U, Recheck A1C, Wenzel Backlund PCP .   Total time spent:30 Minutes Time spent includes review of chart, medications, test results, and follow up plan with the patient.   [place] Controlled Substance Database was reviewed by me.  This patient was seen by Sallyanne Kuster, FNP-C in collaboration with Dr. Beverely Risen as a part of collaborative care agreement.   Chayla Shands R. Tedd Sias, MSN, FNP-C Internal medicine

## 2023-07-28 ENCOUNTER — Encounter: Payer: Self-pay | Admitting: Dermatology

## 2023-07-28 ENCOUNTER — Ambulatory Visit: Payer: BC Managed Care – PPO | Admitting: Dermatology

## 2023-07-28 DIAGNOSIS — W908XXA Exposure to other nonionizing radiation, initial encounter: Secondary | ICD-10-CM

## 2023-07-28 DIAGNOSIS — L578 Other skin changes due to chronic exposure to nonionizing radiation: Secondary | ICD-10-CM | POA: Diagnosis not present

## 2023-07-28 DIAGNOSIS — Z79899 Other long term (current) drug therapy: Secondary | ICD-10-CM

## 2023-07-28 DIAGNOSIS — L219 Seborrheic dermatitis, unspecified: Secondary | ICD-10-CM

## 2023-07-28 DIAGNOSIS — L570 Actinic keratosis: Secondary | ICD-10-CM

## 2023-07-28 MED ORDER — KETOCONAZOLE 2 % EX CREA: TOPICAL_CREAM | CUTANEOUS | 3 refills | Status: DC

## 2023-07-28 MED ORDER — KETOCONAZOLE 2 % EX SHAM: MEDICATED_SHAMPOO | CUTANEOUS | 3 refills | Status: DC

## 2023-07-28 NOTE — Progress Notes (Signed)
 Follow-Up Visit   Subjective  Tyler Powers is a 63 y.o. male who presents for the following: recheck scaly patch nose, hx of sunburn, pt needs refills of seb derm medications Ketoconazole 2% shampoo and Ketoconazole 2% cr The patient has spots, moles and lesions to be evaluated, some may be new or changing and the patient may have concern these could be cancer.  The following portions of the chart were reviewed this encounter and updated as appropriate: medications, allergies, medical history  Review of Systems:  No other skin or systemic complaints except as noted in HPI or Assessment and Plan.  Objective  Well appearing patient in no apparent distress; mood and affect are within normal limits.  A focused examination was performed of the following areas: face Relevant exam findings are noted in the Assessment and Plan.  distal dorsum nose x 1 Pink scaly macules  Assessment & Plan   SEBORRHEIC DERMATITIS Face, scalp Exam: mild scale face Chronic and persistent condition with duration or expected duration over one year. Condition is symptomatic / bothersome to patient. Not to goal.  Seborrheic Dermatitis is a chronic persistent rash characterized by pinkness and scaling most commonly of the mid face but also can occur on the scalp (dandruff), ears; mid chest, mid back and groin.  It tends to be exacerbated by stress and cooler weather.  People who have neurologic disease may experience new onset or exacerbation of existing seborrheic dermatitis.  The condition is not curable but treatable and can be controlled.  Treatment Plan: Cont Ketoconazole 2% cr 3d/wk, Monday, Wednesday, Friday Cont HC 2.5% cr 3d/wk Tuesday, Thursday, Saturday prn flares Cont Ketoconazole 2% shampoo 3d/wk, lets sit 5 minutes and rinse out  Topical steroids (such as triamcinolone, fluocinolone, fluocinonide, mometasone, clobetasol, halobetasol, betamethasone, hydrocortisone) can cause thinning and lightening  of the skin if they are used for too long in the same area. Your physician has selected the right strength medicine for your problem and area affected on the body. Please use your medication only as directed by your physician to prevent side effects.   Long term medication management.  Patient is using long term (months to years) prescription medication  to control their dermatologic condition.  These medications require periodic monitoring to evaluate for efficacy and side effects and may require periodic laboratory monitoring.  AK (ACTINIC KERATOSIS) distal dorsum nose x 1 Actinic keratoses are precancerous spots that appear secondary to cumulative UV radiation exposure/sun exposure over time. They are chronic with expected duration over 1 year. A portion of actinic keratoses will progress to squamous cell carcinoma of the skin. It is not possible to reliably predict which spots will progress to skin cancer and so treatment is recommended to prevent development of skin cancer.  Recommend daily broad spectrum sunscreen SPF 30+ to sun-exposed areas, reapply every 2 hours as needed.  Recommend staying in the shade or wearing long sleeves, sun glasses (UVA+UVB protection) and wide brim hats (4-inch brim around the entire circumference of the hat). Call for new or changing lesions. Destruction of lesion - distal dorsum nose x 1 Complexity: simple   Destruction method: cryotherapy   Informed consent: discussed and consent obtained   Timeout:  patient name, date of birth, surgical site, and procedure verified Lesion destroyed using liquid nitrogen: Yes   Region frozen until ice ball extended beyond lesion: Yes   Outcome: patient tolerated procedure well with no complications   Post-procedure details: wound care instructions given   SEBORRHEIC DERMATITIS  Related Medications hydrocortisone 2.5 % cream Apply topically to affected areas of face, scalp and ears 3 times weekly at bedtime. Tuesday  Thursday Saturday ketoconazole (NIZORAL) 2 % cream Apply topically to affected areas of face, scalp, and ears at 3 times weekly at bedtime on Monday Wed and Friday ketoconazole (NIZORAL) 2 % shampoo apply three times per week, use as a body wash at scalp, face, ears, massage onto affected areas and leave for a few minutes if can, then rinse.  ACTINIC DAMAGE - chronic, secondary to cumulative UV radiation exposure/sun exposure over time - diffuse scaly erythematous macules with underlying dyspigmentation - Recommend daily broad spectrum sunscreen SPF 30+ to sun-exposed areas, reapply every 2 hours as needed.  - Recommend staying in the shade or wearing long sleeves, sun glasses (UVA+UVB protection) and wide brim hats (4-inch brim around the entire circumference of the hat). - Call for new or changing lesions.  Return in about 6 months (around 01/28/2024) for TBSE, Hx of AKs.  I, Ardis Rowan, RMA, am acting as scribe for Armida Sans, MD .   Documentation: I have reviewed the above documentation for accuracy and completeness, and I agree with the above.  Armida Sans, MD

## 2023-07-28 NOTE — Patient Instructions (Addendum)

## 2023-11-01 ENCOUNTER — Encounter: Payer: Self-pay | Admitting: Nurse Practitioner

## 2023-11-01 MED ORDER — INSULIN GLARGINE (1 UNIT DIAL) 300 UNIT/ML ~~LOC~~ SOPN: 10.0000 [IU] | PEN_INJECTOR | Freq: Every day | SUBCUTANEOUS | 3 refills | Status: DC

## 2023-11-17 ENCOUNTER — Other Ambulatory Visit: Payer: Self-pay | Admitting: Nurse Practitioner

## 2023-11-17 DIAGNOSIS — E1169 Type 2 diabetes mellitus with other specified complication: Secondary | ICD-10-CM

## 2023-12-11 ENCOUNTER — Encounter: Payer: Self-pay | Admitting: Nurse Practitioner

## 2023-12-11 ENCOUNTER — Telehealth: Payer: Self-pay | Admitting: Nurse Practitioner

## 2023-12-11 ENCOUNTER — Ambulatory Visit: Admitting: Nurse Practitioner

## 2023-12-11 VITALS — BP 110/70 | HR 65 | Temp 97.4°F | Resp 16 | Ht 68.0 in | Wt 191.6 lb

## 2023-12-11 DIAGNOSIS — E785 Hyperlipidemia, unspecified: Secondary | ICD-10-CM

## 2023-12-11 DIAGNOSIS — E559 Vitamin D deficiency, unspecified: Secondary | ICD-10-CM | POA: Diagnosis not present

## 2023-12-11 DIAGNOSIS — Z794 Long term (current) use of insulin: Secondary | ICD-10-CM | POA: Diagnosis not present

## 2023-12-11 DIAGNOSIS — Z125 Encounter for screening for malignant neoplasm of prostate: Secondary | ICD-10-CM

## 2023-12-11 DIAGNOSIS — M150 Primary generalized (osteo)arthritis: Secondary | ICD-10-CM | POA: Diagnosis not present

## 2023-12-11 DIAGNOSIS — E1169 Type 2 diabetes mellitus with other specified complication: Secondary | ICD-10-CM | POA: Diagnosis not present

## 2023-12-11 DIAGNOSIS — E119 Type 2 diabetes mellitus without complications: Secondary | ICD-10-CM | POA: Insufficient documentation

## 2023-12-11 LAB — POCT GLYCOSYLATED HEMOGLOBIN (HGB A1C): Hemoglobin A1C: 6.1 % — AB (ref 4.0–5.6)

## 2023-12-11 MED ORDER — MOUNJARO 7.5 MG/0.5ML ~~LOC~~ SOAJ: 7.5000 mg | SUBCUTANEOUS | 3 refills | Status: AC

## 2023-12-11 MED ORDER — METFORMIN HCL ER 500 MG PO TB24: 500.0000 mg | ORAL_TABLET | Freq: Two times a day (BID) | ORAL | 1 refills | Status: AC

## 2023-12-11 MED ORDER — OMEGA-3-ACID ETHYL ESTERS 1 G PO CAPS: ORAL_CAPSULE | ORAL | 1 refills | Status: AC

## 2023-12-11 MED ORDER — MELOXICAM 15 MG PO TABS: 15.0000 mg | ORAL_TABLET | Freq: Every day | ORAL | 1 refills | Status: AC

## 2023-12-11 MED ORDER — ROSUVASTATIN CALCIUM 10 MG PO TABS: 10.0000 mg | ORAL_TABLET | Freq: Every day | ORAL | 1 refills | Status: AC

## 2023-12-11 NOTE — Telephone Encounter (Signed)
 Received DOT paperwork from patient. Gave to Alyssa to complete-Toni

## 2023-12-11 NOTE — Progress Notes (Signed)
 [facility] [address] [place], [place] [address]  Internal MEDICINE  Office Visit Note  Patient Name: Tyler Powers  [id number]  [id number]  Date of Service: 12/11/2023  Chief Complaint  Patient presents with   Diabetes   Hyperlipidemia   Follow-up    HPI Autrey presents for a follow-up visit for diabetes, high cholesterol, vitamin D  deficiency, and osteoarthritis.  Diabetes -- well controlled on current regimen. A1c is 6.1.  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  BP is normal High cholesterol -- taking rosuvastatin  daily and lovaza   Osteoarthritis of multiple joints esp the left ankle.  Vitamin D  deficiency -- taking OTC supplement.     Current Medication: Outpatient Encounter Medications as of 12/11/2023  Medication Sig Note   insulin  glargine, 1 Unit Dial , (TOUJEO ) 300 UNIT/ML Solostar Pen Inject 10 Units into the skin at bedtime.    blood glucose meter kit and supplies KIT Dispense based on patient and insurance preference. Use up to four times daily as directed.    doxycycline  (ADOXA) 100 MG tablet Take 1 tablet twice a day    hydrocortisone  2.5 % cream Apply topically to affected areas of face, scalp and ears 3 times weekly at bedtime. Tuesday Thursday Saturday    ibuprofen (ADVIL) 200 MG tablet Take 200 mg by mouth every 6 (six) hours as needed.    Insulin  Pen Needle (PEN NEEDLES) 31G X 5 MM MISC 10 Units by Does not apply route at bedtime. 07/14/2023: Gets pen needles via [email or web address]   ketoconazole  (NIZORAL ) 2 % cream Apply topically to affected areas of face, scalp, and ears at 3 times weekly at bedtime on Monday Wed and Friday    ketoconazole  (NIZORAL ) 2 % shampoo apply three times per week, use as a body wash at scalp, face, ears, massage onto affected areas and leave for a few minutes if can, then rinse.    meloxicam  (MOBIC ) 15 MG tablet Take 1 tablet (15 mg total) by mouth daily.    metFORMIN  (GLUCOPHAGE -XR) 500 MG 24 hr tablet Take 1 tablet (500 mg  total) by mouth in the morning and at bedtime.    naproxen (NAPROSYN) 250 MG tablet Take by mouth.    omega-3 acid ethyl esters (LOVAZA ) 1 g capsule TAKE 2 CAPSULES BY MOUTH TWICE A DAY    rosuvastatin  (CRESTOR ) 10 MG tablet Take 1 tablet (10 mg total) by mouth daily.    tirzepatide  (MOUNJARO ) 7.5 MG/0.5ML Pen Inject 7.5 mg into the skin once a week.    [DISCONTINUED] meloxicam  (MOBIC ) 15 MG tablet TAKE 1 TABLET (15 MG TOTAL) BY MOUTH DAILY.    [DISCONTINUED] metFORMIN  (GLUCOPHAGE -XR) 500 MG 24 hr tablet Take 1 tablet (500 mg total) by mouth in the morning and at bedtime.    [DISCONTINUED] omega-3 acid ethyl esters (LOVAZA ) 1 g capsule TAKE 2 CAPSULES BY MOUTH TWICE A DAY    [DISCONTINUED] rosuvastatin  (CRESTOR ) 10 MG tablet Take 1 tablet (10 mg total) by mouth daily.    [DISCONTINUED] tirzepatide  (MOUNJARO ) 7.5 MG/0.5ML Pen Inject 7.5 mg into the skin once a week.    No facility-administered encounter medications on file as of 12/11/2023.    Surgical History: Past Surgical History:  Procedure Laterality Date   screws in left leg Left     Medical History: Past Medical History:  Diagnosis Date   Actinic keratosis    Diabetes mellitus without complication (HCC)    Hyperlipidemia     Family History: Family History  Problem Relation  Age of Onset   Cancer Mother    COPD Father    Asthma Father    Asthma Brother    Diabetes Maternal Aunt    Varicose Veins Maternal Grandmother     Social History   Socioeconomic History   Marital status: Single    Spouse name: Not on file   Number of children: Not on file   Years of education: Not on file   Highest education level: Not on file  Occupational History   Not on file  Tobacco Use   Smoking status: Former    Types: Cigarettes   Smokeless tobacco: Never  Substance and Sexual Activity   Alcohol use: Never   Drug use: Never   Sexual activity: Not on file  Other Topics Concern   Not on file  Social History Narrative   Not on  file   Social Drivers of Health   Financial Resource Strain: Not on file  Food Insecurity: Not on file  Transportation Needs: Not on file  Physical Activity: Not on file  Stress: Not on file  Social Connections: Not on file  Intimate Partner Violence: Not on file      Review of Systems  Constitutional:  Negative for chills, fatigue and unexpected weight change.  HENT:  Negative for congestion, rhinorrhea, sneezing and sore throat.   Eyes:  Negative for redness.  Respiratory:  Negative for cough, chest tightness and shortness of breath.   Cardiovascular:  Negative for chest pain and palpitations.  Gastrointestinal:  Negative for abdominal pain, constipation, diarrhea, nausea and vomiting.  Genitourinary:  Negative for dysuria and frequency.  Musculoskeletal:  Positive for arthralgias. Negative for back pain, gait problem, joint swelling and neck pain.  Skin:  Negative for rash.  Neurological:  Negative for tremors and numbness.  Hematological:  Negative for adenopathy. Does not bruise/bleed easily.  Psychiatric/Behavioral:  Negative for behavioral problems (Depression), sleep disturbance and suicidal ideas. The patient is not nervous/anxious.     Vital Signs: BP 110/70   Pulse 65   Temp (!) 97.4 F (36.3 C)   Resp 16   Ht 5' 8 (1.727 m)   Wt 191 lb 9.6 oz (86.9 kg)   SpO2 97%   BMI 29.13 kg/m    Physical Exam Vitals reviewed.  Constitutional:      General: He is not in acute distress.    Appearance: Normal appearance. He is obese. He is not ill-appearing.  HENT:     Head: Normocephalic and atraumatic.  Eyes:     Pupils: Pupils are equal, round, and reactive to light.  Cardiovascular:     Rate and Rhythm: Normal rate and regular rhythm.  Pulmonary:     Effort: Pulmonary effort is normal. No respiratory distress.  Neurological:     Mental Status: He is alert and oriented to person, place, and time.  Psychiatric:        Mood and Affect: Mood normal.         Behavior: Behavior normal.        Assessment/Plan: 1. Type 2 diabetes mellitus with other specified complication, with long-term current use of insulin  (HCC) (Primary) A1c is stable at 6.1 today. Continue metformin , toujeo  and mounjaro  as prescribed. Routine labs ordered  - POCT glycosylated hemoglobin (Hb A1C) - metFORMIN  (GLUCOPHAGE -XR) 500 MG 24 hr tablet; Take 1 tablet (500 mg total) by mouth in the morning and at bedtime.  Dispense: 180 tablet; Refill: 1 - tirzepatide  (MOUNJARO ) 7.5 MG/0.5ML Pen; Inject 7.5  mg into the skin once a week.  Dispense: 6 mL; Refill: 3 - CBC with Differential/Platelet - CMP14+EGFR - Lipid Profile - Hgb A1C w/o eAG - Urine Microalbumin w/creat. ratio  2. Hyperlipidemia associated with type 2 diabetes mellitus (HCC) Continue rosuvastatin  and lovaza  as prescribed. Routine labs ordered  - omega-3 acid ethyl esters (LOVAZA ) 1 g capsule; TAKE 2 CAPSULES BY MOUTH TWICE A DAY  Dispense: 360 capsule; Refill: 1 - rosuvastatin  (CRESTOR ) 10 MG tablet; Take 1 tablet (10 mg total) by mouth daily.  Dispense: 90 tablet; Refill: 1 - CBC with Differential/Platelet - CMP14+EGFR - Lipid Profile - Hgb A1C w/o eAG  3. Primary osteoarthritis involving multiple joints Continue meloxicam  as prescribed.  - meloxicam  (MOBIC ) 15 MG tablet; Take 1 tablet (15 mg total) by mouth daily.  Dispense: 90 tablet; Refill: 1  4. Vitamin D  deficiency Routine lab ordered  - Vitamin D  (25 hydroxy)  5. Screening for prostate cancer Routine lab ordered  - PSA Total (Reflex To Free)   General Counseling: Dasie oakland understanding of the findings of todays visit and agrees with plan of treatment. I have discussed any further diagnostic evaluation that may be needed or ordered today. We also reviewed his medications today. he has been encouraged to call the office with any questions or concerns that should arise related to todays visit.    Orders Placed This Encounter  Procedures    CBC with Differential/Platelet   CMP14+EGFR   Lipid Profile   Hgb A1C w/o eAG   Vitamin D  (25 hydroxy)   PSA Total (Reflex To Free)   Urine Microalbumin w/creat. ratio   POCT glycosylated hemoglobin (Hb A1C)    Meds ordered this encounter  Medications   omega-3 acid ethyl esters (LOVAZA ) 1 g capsule    Sig: TAKE 2 CAPSULES BY MOUTH TWICE A DAY    Dispense:  360 capsule    Refill:  1   metFORMIN  (GLUCOPHAGE -XR) 500 MG 24 hr tablet    Sig: Take 1 tablet (500 mg total) by mouth in the morning and at bedtime.    Dispense:  180 tablet    Refill:  1    For future refills, keep on file   meloxicam  (MOBIC ) 15 MG tablet    Sig: Take 1 tablet (15 mg total) by mouth daily.    Dispense:  90 tablet    Refill:  1   rosuvastatin  (CRESTOR ) 10 MG tablet    Sig: Take 1 tablet (10 mg total) by mouth daily.    Dispense:  90 tablet    Refill:  1   tirzepatide  (MOUNJARO ) 7.5 MG/0.5ML Pen    Sig: Inject 7.5 mg into the skin once a week.    Dispense:  6 mL    Refill:  3    For future refills.    Return for previously scheduled, CPE, Natali Lavallee PCP in november.   Total time spent:30 Minutes Time spent includes review of chart, medications, test results, and follow up plan with the patient.   [place] Controlled Substance Database was reviewed by me.  This patient was seen by Mardy Maxin, FNP-C in collaboration with Dr. Sigrid Bathe as a part of collaborative care agreement.   Oma Alpert R. Maxin, MSN, FNP-C Internal medicine

## 2023-12-13 LAB — MICROALBUMIN / CREATININE URINE RATIO
Creatinine, Urine: 94.4 mg/dL
Microalb/Creat Ratio: 11 mg/g{creat} (ref 0–29)
Microalbumin, Urine: 10.1 ug/mL

## 2024-01-06 LAB — CMP14+EGFR
ALT: 27 IU/L (ref 0–44)
AST: 23 IU/L (ref 0–40)
Albumin: 5 g/dL — ABNORMAL HIGH (ref 3.9–4.9)
Alkaline Phosphatase: 82 IU/L (ref 44–121)
BUN/Creatinine Ratio: 26 — ABNORMAL HIGH (ref 10–24)
BUN: 20 mg/dL (ref 8–27)
Bilirubin Total: 0.5 mg/dL (ref 0.0–1.2)
CO2: 20 mmol/L (ref 20–29)
Calcium: 10.2 mg/dL (ref 8.6–10.2)
Chloride: 102 mmol/L (ref 96–106)
Creatinine, Ser: 0.77 mg/dL (ref 0.76–1.27)
Globulin, Total: 2.4 g/dL (ref 1.5–4.5)
Glucose: 94 mg/dL (ref 70–99)
Potassium: 5.3 mmol/L — ABNORMAL HIGH (ref 3.5–5.2)
Sodium: 139 mmol/L (ref 134–144)
Total Protein: 7.4 g/dL (ref 6.0–8.5)
eGFR: 102 mL/min/1.73 (ref >59)

## 2024-01-06 LAB — LIPID PANEL
Chol/HDL Ratio: 2.9 ratio (ref 0.0–5.0)
Cholesterol, Total: 123 mg/dL (ref 100–199)
HDL: 42 mg/dL (ref >39)
LDL Chol Calc (NIH): 44 mg/dL (ref 0–99)
Triglycerides: 239 mg/dL — ABNORMAL HIGH (ref 0–149)
VLDL Cholesterol Cal: 37 mg/dL (ref 5–40)

## 2024-01-06 LAB — CBC WITH DIFFERENTIAL/PLATELET
Basophils Absolute: 0.1 x10E3/uL (ref 0.0–0.2)
Basos: 1 %
EOS (ABSOLUTE): 0.4 x10E3/uL (ref 0.0–0.4)
Eos: 4 %
Hematocrit: 51.5 % — ABNORMAL HIGH (ref 37.5–51.0)
Hemoglobin: 16.5 g/dL (ref 13.0–17.7)
Immature Grans (Abs): 0 x10E3/uL (ref 0.0–0.1)
Immature Granulocytes: 0 %
Lymphocytes Absolute: 3.2 x10E3/uL — ABNORMAL HIGH (ref 0.7–3.1)
Lymphs: 32 %
MCH: 31 pg (ref 26.6–33.0)
MCHC: 32 g/dL (ref 31.5–35.7)
MCV: 97 fL (ref 79–97)
Monocytes Absolute: 0.6 x10E3/uL (ref 0.1–0.9)
Monocytes: 6 %
Neutrophils Absolute: 5.8 x10E3/uL (ref 1.4–7.0)
Neutrophils: 57 %
Platelets: 241 x10E3/uL (ref 150–450)
RBC: 5.33 x10E6/uL (ref 4.14–5.80)
RDW: 12.5 % (ref 11.6–15.4)
WBC: 10 x10E3/uL (ref 3.4–10.8)

## 2024-01-06 LAB — PSA TOTAL (REFLEX TO FREE): Prostate Specific Ag, Serum: 0.5 ng/mL (ref 0.0–4.0)

## 2024-01-06 LAB — HGB A1C W/O EAG: Hgb A1c MFr Bld: 5.8 % — ABNORMAL HIGH (ref 4.8–5.6)

## 2024-01-06 LAB — VITAMIN D 25 HYDROXY (VIT D DEFICIENCY, FRACTURES): Vit D, 25-Hydroxy: 41.7 ng/mL (ref 30.0–100.0)

## 2024-01-20 ENCOUNTER — Telehealth: Payer: Self-pay | Admitting: Nurse Practitioner

## 2024-01-20 NOTE — Telephone Encounter (Signed)
 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 Per Mardy, she will do letter if needed-Toni

## 2024-01-26 ENCOUNTER — Encounter: Payer: Self-pay | Admitting: Dermatology

## 2024-01-26 ENCOUNTER — Ambulatory Visit: Admitting: Dermatology

## 2024-01-26 DIAGNOSIS — L719 Rosacea, unspecified: Secondary | ICD-10-CM

## 2024-01-26 DIAGNOSIS — L814 Other melanin hyperpigmentation: Secondary | ICD-10-CM

## 2024-01-26 DIAGNOSIS — L821 Other seborrheic keratosis: Secondary | ICD-10-CM

## 2024-01-26 DIAGNOSIS — L578 Other skin changes due to chronic exposure to nonionizing radiation: Secondary | ICD-10-CM | POA: Diagnosis not present

## 2024-01-26 DIAGNOSIS — W908XXA Exposure to other nonionizing radiation, initial encounter: Secondary | ICD-10-CM | POA: Diagnosis not present

## 2024-01-26 DIAGNOSIS — D1801 Hemangioma of skin and subcutaneous tissue: Secondary | ICD-10-CM

## 2024-01-26 DIAGNOSIS — Z79899 Other long term (current) drug therapy: Secondary | ICD-10-CM

## 2024-01-26 DIAGNOSIS — L219 Seborrheic dermatitis, unspecified: Secondary | ICD-10-CM | POA: Diagnosis not present

## 2024-01-26 DIAGNOSIS — Z1283 Encounter for screening for malignant neoplasm of skin: Secondary | ICD-10-CM

## 2024-01-26 DIAGNOSIS — D229 Melanocytic nevi, unspecified: Secondary | ICD-10-CM

## 2024-01-26 DIAGNOSIS — L570 Actinic keratosis: Secondary | ICD-10-CM | POA: Diagnosis not present

## 2024-01-26 DIAGNOSIS — Z7189 Other specified counseling: Secondary | ICD-10-CM

## 2024-01-26 DIAGNOSIS — L918 Other hypertrophic disorders of the skin: Secondary | ICD-10-CM

## 2024-01-26 MED ORDER — KETOCONAZOLE 2 % EX SHAM: MEDICATED_SHAMPOO | CUTANEOUS | 3 refills | Status: AC

## 2024-01-26 MED ORDER — HYDROCORTISONE 2.5 % EX CREA: TOPICAL_CREAM | CUTANEOUS | 3 refills | Status: AC

## 2024-01-26 MED ORDER — KETOCONAZOLE 2 % EX CREA: TOPICAL_CREAM | CUTANEOUS | 3 refills | Status: AC

## 2024-01-26 NOTE — Progress Notes (Signed)
 Follow-Up Visit   Subjective  Tyler Powers is a 63 y.o. male who presents for the following: Skin Cancer Screening and Full Body Skin Exam hx of Aks, Seborrheic Dermatitis  Ketoconazole  2% cr 3x/wk, HC 2.5% cr 3d/wk, Ketoconazole  2% shampoo 3x/wk  The patient presents for Total-Body Skin Exam (TBSE) for skin cancer screening and mole check. The patient has spots, moles and lesions to be evaluated, some may be new or changing and the patient may have concern these could be cancer.   The following portions of the chart were reviewed this encounter and updated as appropriate: medications, allergies, medical history  Review of Systems:  No other skin or systemic complaints except as noted in HPI or Assessment and Plan.  Objective  Well appearing patient in no apparent distress; mood and affect are within normal limits.  A full examination was performed including scalp, head, eyes, ears, nose, lips, neck, chest, axillae, abdomen, back, buttocks, bilateral upper extremities, bilateral lower extremities, hands, feet, fingers, toes, fingernails, and toenails. All findings within normal limits unless otherwise noted below.   Relevant physical exam findings are noted in the Assessment and Plan.  bil temple x 8 (8) Pink scaly macules  Assessment & Plan   SKIN CANCER SCREENING PERFORMED TODAY.  ACTINIC DAMAGE - Chronic condition, secondary to cumulative UV/sun exposure - diffuse scaly erythematous macules with underlying dyspigmentation - Recommend daily broad spectrum sunscreen SPF 30+ to sun-exposed areas, reapply every 2 hours as needed.  - Staying in the shade or wearing long sleeves, sun glasses (UVA+UVB protection) and wide brim hats (4-inch brim around the entire circumference of the hat) are also recommended for sun protection.  - Call for new or changing lesions.  LENTIGINES, SEBORRHEIC KERATOSES, HEMANGIOMAS - Benign normal skin lesions - Benign-appearing - Call for any  changes  MELANOCYTIC NEVI - Tan-brown and/or pink-flesh-colored symmetric macules and papules - Benign appearing on exam today - Observation - Call clinic for new or changing moles - Recommend daily use of broad spectrum spf 30+ sunscreen to sun-exposed areas.   SEBORRHEIC DERMATITIS Face, scalp, ears Exam: mild scale face, scalp, ears Chronic and persistent condition with duration or expected duration over one year. Condition is improving with treatment but not currently at goal. Seborrheic Dermatitis is a chronic persistent rash characterized by pinkness and scaling most commonly of the mid face but also can occur on the scalp (dandruff), ears; mid chest, mid back and groin.  It tends to be exacerbated by stress and cooler weather.  People who have neurologic disease may experience new onset or exacerbation of existing seborrheic dermatitis.  The condition is not curable but treatable and can be controlled. Treatment Plan: Cont Ketoconazole  2% cr 3d/wk, Monday, Wednesday, Friday Cont HC 2.5% cr 3d/wk Tuesday, Thursday, Saturday prn flares Cont Ketoconazole  2% shampoo 3d/wk, lets sit 5 minutes and rinse out    Topical steroids (such as triamcinolone, fluocinolone, fluocinonide, mometasone, clobetasol, halobetasol, betamethasone, hydrocortisone ) can cause thinning and lightening of the skin if they are used for too long in the same area. Your physician has selected the right strength medicine for your problem and area affected on the body. Please use your medication only as directed by your physician to prevent side effects.  Long term medication management.  Patient is using long term (months to years) prescription medication  to control their dermatologic condition.  These medications require periodic monitoring to evaluate for efficacy and side effects and may require periodic laboratory monitoring.  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 Condition is symptomatic / bothersome to patient. Not to goal. Rosacea is a chronic progressive skin condition usually affecting the face of adults, causing redness and/or acne bumps. It is treatable but not curable. It sometimes affects the eyes (ocular rosacea) as well. It may respond to topical and/or systemic medication and can flare with stress, sun exposure, alcohol, exercise, topical steroids (including hydrocortisone /cortisone 10) and some foods.  Daily application of broad spectrum spf 30+ sunscreen to face is recommended to reduce flares. Treatment Plan Counseling for BBL / IPL / Laser and Coordination of Care Discussed the treatment option of Broad Band Light (BBL) /Intense Pulsed Light (IPL)/ Laser for skin discoloration, including brown spots and redness.  Typically we recommend at least 1-3 treatment sessions about 5-8 weeks apart for best results.  Cannot have tanned skin when BBL performed, and regular use of sunscreen/photoprotection is advised after the procedure to help maintain results. The patient's condition may also require maintenance treatments in the future.  The fee for BBL / laser treatments is $350 per treatment session for the whole face.  A fee can be quoted for other parts of the body.  Insurance typically does not pay for BBL/laser treatments and therefore the fee is an out-of-pocket cost. Recommend prophylactic valtrex treatment. Once scheduled for procedure, will send Rx in prior to patient's appointment.   Acrochordons (Skin Tags) neck - Fleshy, skin-colored pedunculated papules - Benign appearing.  - Observe. - If desired, they can be removed with an in office procedure that is not covered by insurance. - Please call the clinic if you notice any new or changing lesions.   AK (ACTINIC KERATOSIS) (8) bil temple x 8 (8) Actinic keratoses are precancerous spots that appear secondary to cumulative  UV radiation exposure/sun exposure over time. They are chronic with expected duration over 1 year. A portion of actinic keratoses will progress to squamous cell carcinoma of the skin. It is not possible to reliably predict which spots will progress to skin cancer and so treatment is recommended to prevent development of skin cancer.  Recommend daily broad spectrum sunscreen SPF 30+ to sun-exposed areas, reapply every 2 hours as needed.  Recommend staying in the shade or wearing long sleeves, sun glasses (UVA+UVB protection) and wide brim hats (4-inch brim around the entire circumference of the hat). Call for new or changing lesions. Destruction of lesion - bil temple x 8 (8) Complexity: simple   Destruction method: cryotherapy   Informed consent: discussed and consent obtained   Timeout:  patient name, date of birth, surgical site, and procedure verified Lesion destroyed using liquid nitrogen: Yes   Region frozen until ice ball extended beyond lesion: Yes   Outcome: patient tolerated procedure well with no complications   Post-procedure details: wound care instructions given    SEBORRHEIC DERMATITIS   Related Medications ketoconazole  (NIZORAL ) 2 % cream Apply topically to affected areas of face, scalp, and ears 3 times weekly at bedtime on Monday Wed and Friday for seborrheic dermatitis ketoconazole  (NIZORAL ) 2 % shampoo apply three times per week, use as a body wash at scalp, face, ears, massage onto affected areas and leave for 5 minutes and then rinse off, for seborrheic dermatitis hydrocortisone  2.5 % cream Apply topically to affected areas of face, scalp and ears 3 times weekly at bedtime. Tuesday Thursday Saturday for seborrheic dermatitis Return in about  1 year (around 01/25/2025) for TBSE, Hx of AKs.  I, Grayce Saunas, RMA, am acting as scribe for Alm Rhyme, MD .   Documentation: I have reviewed the above documentation for accuracy and completeness, and I agree with the  above.  Alm Rhyme, MD

## 2024-01-26 NOTE — Patient Instructions (Addendum)

## 2024-02-24 ENCOUNTER — Ambulatory Visit: Payer: Self-pay | Admitting: Internal Medicine

## 2024-03-17 ENCOUNTER — Encounter: Payer: BC Managed Care – PPO | Admitting: Nurse Practitioner

## 2024-03-18 ENCOUNTER — Encounter: Payer: Self-pay | Admitting: Nurse Practitioner

## 2024-03-18 ENCOUNTER — Ambulatory Visit: Admitting: Nurse Practitioner

## 2024-03-18 VITALS — BP 116/70 | HR 70 | Temp 96.6°F | Resp 16 | Ht 68.0 in | Wt 195.6 lb

## 2024-03-18 DIAGNOSIS — K760 Fatty (change of) liver, not elsewhere classified: Secondary | ICD-10-CM | POA: Diagnosis not present

## 2024-03-18 DIAGNOSIS — E785 Hyperlipidemia, unspecified: Secondary | ICD-10-CM

## 2024-03-18 DIAGNOSIS — Z23 Encounter for immunization: Secondary | ICD-10-CM

## 2024-03-18 DIAGNOSIS — E875 Hyperkalemia: Secondary | ICD-10-CM | POA: Diagnosis not present

## 2024-03-18 DIAGNOSIS — E1169 Type 2 diabetes mellitus with other specified complication: Secondary | ICD-10-CM

## 2024-03-18 DIAGNOSIS — R300 Dysuria: Secondary | ICD-10-CM

## 2024-03-18 DIAGNOSIS — Z0001 Encounter for general adult medical examination with abnormal findings: Secondary | ICD-10-CM | POA: Diagnosis not present

## 2024-03-18 DIAGNOSIS — Z794 Long term (current) use of insulin: Secondary | ICD-10-CM

## 2024-03-18 MED ORDER — PNEUMOCOCCAL 20-VAL CONJ VACC 0.5 ML IM SUSY: 0.5000 mL | PREFILLED_SYRINGE | Freq: Once | INTRAMUSCULAR | 0 refills | Status: AC | PRN

## 2024-03-18 NOTE — Progress Notes (Signed)
 [facility] [address] [place], [place] [address]  Internal MEDICINE  Office Visit Note  Patient Name: Tyler Powers  [id number]  [id number]  Date of Service: 03/18/2024  Chief Complaint  Patient presents with   Diabetes   Annual Exam   Hyperlipidemia    HPI Tyler Powers presents for an annual well visit and physical exam.  Well-appearing 63 y.o. male with diabetes and hyperlipidemia.  Routine CRC screening: due in January 2026 Eye exam: was done on January 26 2024 at [facility] by Dr. Rocky Mace, optometrist. Results was no diabetic retinopathy found. And no change in his vision.  Foot exam: done  Labs: lab results discussed with the patient  A1c imrpoved to 5.8 on labs Hematocrit is slightly elevated Cholesterol panel was normal except for elevated triglycerides 239 Slightly elevated potassium 5.3  PSA is normal Urine microalbumin was normal New or worsening pain: none  Other concerns: none    Fibrosis 4 Score = 1.12 (Low risk)      Interpretation for patients with NAFLD          <1.30       -  F0-F1 (Low risk)          1.30-2.67 -  Indeterminate           >2.67      -  F3-F4 (High risk)     Validated for ages 78-65         Current Medication: Outpatient Encounter Medications as of 03/18/2024  Medication Sig Note   insulin  glargine, 1 Unit Dial , (TOUJEO ) 300 UNIT/ML Solostar Pen Inject 10 Units into the skin at bedtime.    pneumococcal 20-valent conjugate vaccine (PREVNAR 20) 0.5 ML injection Inject 0.5 mLs into the muscle once as needed for up to 1 dose for immunization.    blood glucose meter kit and supplies KIT Dispense based on patient and insurance preference. Use up to four times daily as directed.    hydrocortisone  2.5 % cream Apply topically to affected areas of face, scalp and ears 3 times weekly at bedtime. Tuesday Thursday Saturday for seborrheic dermatitis    Insulin  Pen Needle (PEN NEEDLES) 31G X 5 MM MISC 10 Units by Does not apply route  at bedtime. 07/14/2023: Gets pen needles via [email or web address]   ketoconazole  (NIZORAL ) 2 % cream Apply topically to affected areas of face, scalp, and ears 3 times weekly at bedtime on Monday Wed and Friday for seborrheic dermatitis    ketoconazole  (NIZORAL ) 2 % shampoo apply three times per week, use as a body wash at scalp, face, ears, massage onto affected areas and leave for 5 minutes and then rinse off, for seborrheic dermatitis    meloxicam  (MOBIC ) 15 MG tablet Take 1 tablet (15 mg total) by mouth daily.    metFORMIN  (GLUCOPHAGE -XR) 500 MG 24 hr tablet Take 1 tablet (500 mg total) by mouth in the morning and at bedtime.    naproxen (NAPROSYN) 250 MG tablet Take by mouth. (Patient not taking: Reported on 03/18/2024)    omega-3 acid ethyl esters (LOVAZA ) 1 g capsule TAKE 2 CAPSULES BY MOUTH TWICE A DAY    rosuvastatin  (CRESTOR ) 10 MG tablet Take 1 tablet (10 mg total) by mouth daily.    tirzepatide  (MOUNJARO ) 7.5 MG/0.5ML Pen Inject 7.5 mg into the skin once a week.    [DISCONTINUED] doxycycline  (ADOXA) 100 MG tablet Take 1 tablet twice a day (Patient not taking: Reported on 03/18/2024)    [DISCONTINUED] ibuprofen (ADVIL) 200  MG tablet Take 200 mg by mouth every 6 (six) hours as needed. (Patient not taking: Reported on 03/18/2024)    No facility-administered encounter medications on file as of 03/18/2024.    Surgical History: Past Surgical History:  Procedure Laterality Date   screws in left leg Left     Medical History: Past Medical History:  Diagnosis Date   Actinic keratosis    Diabetes mellitus without complication (HCC)    Hyperlipidemia     Family History: Family History  Problem Relation Age of Onset   Cancer Mother    COPD Father    Asthma Father    Asthma Brother    Diabetes Maternal Aunt    Varicose Veins Maternal Grandmother     Social History   Socioeconomic History   Marital status: Single    Spouse name: Not on file   Number of children: Not on file   Years of  education: Not on file   Highest education level: Not on file  Occupational History   Not on file  Tobacco Use   Smoking status: Former    Types: Cigarettes   Smokeless tobacco: Never  Substance and Sexual Activity   Alcohol use: Never   Drug use: Never   Sexual activity: Not on file  Other Topics Concern   Not on file  Social History Narrative   Not on file   Social Drivers of Health   Financial Resource Strain: Not on file  Food Insecurity: Not on file  Transportation Needs: Not on file  Physical Activity: Not on file  Stress: Not on file  Social Connections: Not on file  Intimate Partner Violence: Not on file      Review of Systems  Constitutional:  Negative for chills, fatigue and unexpected weight change.  HENT:  Negative for congestion, rhinorrhea, sneezing and sore throat.   Eyes:  Negative for redness.  Respiratory:  Negative for cough, chest tightness and shortness of breath.   Cardiovascular:  Negative for chest pain and palpitations.  Gastrointestinal:  Negative for abdominal pain, constipation, diarrhea, nausea and vomiting.  Genitourinary:  Negative for dysuria and frequency.  Musculoskeletal:  Positive for arthralgias. Negative for back pain, gait problem, joint swelling and neck pain.  Skin:  Negative for rash.  Neurological:  Negative for tremors and numbness.  Hematological:  Negative for adenopathy. Does not bruise/bleed easily.  Psychiatric/Behavioral:  Negative for behavioral problems (Depression), sleep disturbance and suicidal ideas. The patient is not nervous/anxious.     Vital Signs: BP 116/70   Pulse 70   Temp (!) 96.6 F (35.9 C)   Resp 16   Ht 5' 8 (1.727 m)   Wt 195 lb 9.6 oz (88.7 kg)   SpO2 95%   BMI 29.74 kg/m    Physical Exam Vitals reviewed.  Constitutional:      General: He is not in acute distress.    Appearance: Normal appearance. He is obese. He is not ill-appearing.  HENT:     Head: Normocephalic and atraumatic.   Eyes:     Pupils: Pupils are equal, round, and reactive to light.  Cardiovascular:     Rate and Rhythm: Normal rate and regular rhythm.  Pulmonary:     Effort: Pulmonary effort is normal. No respiratory distress.  Neurological:     Mental Status: He is alert and oriented to person, place, and time.  Psychiatric:        Mood and Affect: Mood normal.  Behavior: Behavior normal.        Assessment/Plan: 1. Encounter for routine adult health examination with abnormal findings (Primary) Age-appropriate preventive screenings and vaccinations discussed, annual physical exam completed. Routine labs for health maintenance results discussed with the patient. PHM updated.    2. Type 2 diabetes mellitus with other specified complication, with long-term current use of insulin  (HCC) Continue insulin , metformin  and mounjaro  as prescribed. Diabetic eye exam was done in September.   3. Hyperlipidemia associated with type 2 diabetes mellitus (HCC) Continue rosuvastatin  as prescribed.   4. NAFLD (nonalcoholic fatty liver disease) Liver ultrasound ordered, last done in 2022 - US  Abdomen Limited RUQ (LIVER/GB); Future  5. Hyperkalemia Repeat potassium level  - Potassium  6. Need for vaccination - pneumococcal 20-valent conjugate vaccine (PREVNAR 20) 0.5 ML injection; Inject 0.5 mLs into the muscle once as needed for up to 1 dose for immunization.  Dispense: 0.5 mL; Refill: 0       General Counseling: Tyler Powers verbalizes understanding of the findings of todays visit and agrees with plan of treatment. I have discussed any further diagnostic evaluation that may be needed or ordered today. We also reviewed his medications today. he has been encouraged to call the office with any questions or concerns that should arise related to todays visit.    Orders Placed This Encounter  Procedures   US  Abdomen Limited RUQ (LIVER/GB)   Potassium    Meds ordered this encounter  Medications    pneumococcal 20-valent conjugate vaccine (PREVNAR 20) 0.5 ML injection    Sig: Inject 0.5 mLs into the muscle once as needed for up to 1 dose for immunization.    Dispense:  0.5 mL    Refill:  0    Due for prevnar 20    Return in about 4 months (around 07/16/2024) for F/U, Recheck A1C, [facility] PCP and will also need f/u for liver US  whenever this is done .   Total time spent:30 Minutes Time spent includes review of chart, medications, test results, and follow up plan with the patient.   [place] Controlled Substance Database was reviewed by me.  This patient was seen by Mardy Maxin, FNP-C in collaboration with Dr. Sigrid Bathe as a part of collaborative care agreement.  Aldin Drees R. Maxin, MSN, FNP-C Internal medicine

## 2024-03-19 LAB — MICROSCOPIC EXAMINATION
Bacteria, UA: NONE SEEN
Casts: NONE SEEN /LPF
Epithelial Cells (non renal): NONE SEEN /HPF (ref 0–10)
RBC, Urine: NONE SEEN /HPF (ref 0–2)
WBC, UA: NONE SEEN /HPF (ref 0–5)

## 2024-03-19 LAB — UA/M W/RFLX CULTURE, ROUTINE
Bilirubin, UA: NEGATIVE
Glucose, UA: NEGATIVE
Ketones, UA: NEGATIVE
Leukocytes,UA: NEGATIVE
Nitrite, UA: NEGATIVE
Protein,UA: NEGATIVE
RBC, UA: NEGATIVE
Specific Gravity, UA: 1.018 (ref 1.005–1.030)
Urobilinogen, Ur: 0.2 mg/dL (ref 0.2–1.0)
pH, UA: 6.5 (ref 5.0–7.5)

## 2024-03-21 ENCOUNTER — Encounter: Payer: Self-pay | Admitting: Nurse Practitioner

## 2024-03-23 ENCOUNTER — Other Ambulatory Visit: Payer: Self-pay

## 2024-03-23 ENCOUNTER — Encounter: Payer: Self-pay | Admitting: Nurse Practitioner

## 2024-03-23 ENCOUNTER — Other Ambulatory Visit: Payer: Self-pay | Admitting: Nurse Practitioner

## 2024-03-23 MED ORDER — INSULIN GLARGINE (1 UNIT DIAL) 300 UNIT/ML ~~LOC~~ SOPN: 10.0000 [IU] | PEN_INJECTOR | Freq: Every day | SUBCUTANEOUS | 3 refills | Status: DC

## 2024-03-26 LAB — POTASSIUM: Potassium: 4.9 mmol/L (ref 3.5–5.2)

## 2024-04-08 ENCOUNTER — Ambulatory Visit
Admission: RE | Admit: 2024-04-08 | Discharge: 2024-04-08 | Disposition: A | Source: Ambulatory Visit | Attending: Nurse Practitioner

## 2024-04-08 DIAGNOSIS — K760 Fatty (change of) liver, not elsewhere classified: Secondary | ICD-10-CM

## 2024-04-22 ENCOUNTER — Encounter: Payer: Self-pay | Admitting: Nurse Practitioner

## 2024-04-22 ENCOUNTER — Ambulatory Visit: Admitting: Nurse Practitioner

## 2024-04-22 VITALS — BP 114/70 | HR 69 | Temp 96.4°F | Resp 16 | Ht 68.0 in | Wt 195.0 lb

## 2024-04-22 DIAGNOSIS — E1169 Type 2 diabetes mellitus with other specified complication: Secondary | ICD-10-CM

## 2024-04-22 DIAGNOSIS — Z794 Long term (current) use of insulin: Secondary | ICD-10-CM

## 2024-04-22 DIAGNOSIS — E875 Hyperkalemia: Secondary | ICD-10-CM | POA: Diagnosis not present

## 2024-04-22 DIAGNOSIS — E785 Hyperlipidemia, unspecified: Secondary | ICD-10-CM

## 2024-04-22 DIAGNOSIS — K760 Fatty (change of) liver, not elsewhere classified: Secondary | ICD-10-CM | POA: Diagnosis not present

## 2024-04-22 NOTE — Progress Notes (Unsigned)
 [facility] [address] [place], [place] [address]  Internal MEDICINE  Office Visit Note  Patient Name: Tyler Powers  [id number]  [id number]  Date of Service: 04/22/2024  Chief Complaint  Patient presents with   Diabetes   Hyperlipidemia   Follow-up    Review abdominal u/s     HPI Tyler Powers presents for a follow-up visit for labs and ultrasound results.  Hyperkalemia -- potassium improved to normal range.  Liver US  -- liver is not enlarged but still showing fatty liver.  Received pneumonia vaccine  Also had to switch basal insulin  due to insurance formulary preference.  High cholesterol -- on statin therapy.    Current Medication: Outpatient Encounter Medications as of 04/22/2024  Medication Sig Note   blood glucose meter kit and supplies KIT Dispense based on patient and insurance preference. Use up to four times daily as directed.    hydrocortisone  2.5 % cream Apply topically to affected areas of face, scalp and ears 3 times weekly at bedtime. Tuesday Thursday Saturday for seborrheic dermatitis    insulin  degludec (TRESIBA FLEXTOUCH) 200 UNIT/ML FlexTouch Pen Inject 10 Units into the skin daily.    Insulin  Pen Needle (PEN NEEDLES) 31G X 5 MM MISC 10 Units by Does not apply route at bedtime. 07/14/2023: Gets pen needles via [email or web address]   ketoconazole  (NIZORAL ) 2 % cream Apply topically to affected areas of face, scalp, and ears 3 times weekly at bedtime on Monday Wed and Friday for seborrheic dermatitis    ketoconazole  (NIZORAL ) 2 % shampoo apply three times per week, use as a body wash at scalp, face, ears, massage onto affected areas and leave for 5 minutes and then rinse off, for seborrheic dermatitis    meloxicam  (MOBIC ) 15 MG tablet Take 1 tablet (15 mg total) by mouth daily.    metFORMIN  (GLUCOPHAGE -XR) 500 MG 24 hr tablet Take 1 tablet (500 mg total) by mouth in the morning and at bedtime.    naproxen (NAPROSYN) 250 MG tablet Take by mouth. (Patient not taking:  Reported on 03/18/2024)    omega-3 acid ethyl esters (LOVAZA ) 1 g capsule TAKE 2 CAPSULES BY MOUTH TWICE A DAY    pneumococcal 20-valent conjugate vaccine (PREVNAR 20) 0.5 ML injection Inject 0.5 mLs into the muscle once as needed for up to 1 dose for immunization. (Patient not taking: Reported on 04/22/2024)    rosuvastatin  (CRESTOR ) 10 MG tablet Take 1 tablet (10 mg total) by mouth daily.    tirzepatide  (MOUNJARO ) 7.5 MG/0.5ML Pen Inject 7.5 mg into the skin once a week.    No facility-administered encounter medications on file as of 04/22/2024.    Surgical History: Past Surgical History:  Procedure Laterality Date   screws in left leg Left     Medical History: Past Medical History:  Diagnosis Date   Actinic keratosis    Diabetes mellitus without complication (HCC)    Hyperlipidemia     Family History: Family History  Problem Relation Age of Onset   Cancer Mother    COPD Father    Asthma Father    Asthma Brother    Diabetes Maternal Aunt    Varicose Veins Maternal Grandmother     Social History   Socioeconomic History   Marital status: Single    Spouse name: Not on file   Number of children: Not on file   Years of education: Not on file   Highest education level: Not on file  Occupational History   Not on  file  Tobacco Use   Smoking status: Former    Types: Cigarettes   Smokeless tobacco: Never  Substance and Sexual Activity   Alcohol use: Never   Drug use: Never   Sexual activity: Not on file  Other Topics Concern   Not on file  Social History Narrative   Not on file   Social Drivers of Health   Tobacco Use: Medium Risk (04/22/2024)   Patient History    Smoking Tobacco Use: Former    Smokeless Tobacco Use: Never    Passive Exposure: Not on [place] Strain: Not on file  Food Insecurity: Not on file  Transportation Needs: Not on file  Physical Activity: Not on file  Stress: Not on file  Social Connections: Not on file  Intimate  Partner Violence: Not on file  Depression (PHQ2-9): Low Risk (03/18/2024)   Depression (PHQ2-9)    PHQ-2 Score: 0  Alcohol Screen: Low Risk (11/08/2021)   Alcohol Screen    Last Alcohol Screening Score (AUDIT): 0  Housing: Not on file  Utilities: Not on file  Health Literacy: Not on file      Review of Systems  Constitutional:  Negative for chills, fatigue and unexpected weight change.  HENT:  Negative for congestion, rhinorrhea, sneezing and sore throat.   Eyes:  Negative for redness.  Respiratory:  Negative for cough, chest tightness and shortness of breath.   Cardiovascular:  Negative for chest pain and palpitations.  Gastrointestinal:  Negative for abdominal pain, constipation, diarrhea, nausea and vomiting.  Genitourinary:  Negative for dysuria and frequency.  Musculoskeletal:  Positive for arthralgias. Negative for back pain, gait problem, joint swelling and neck pain.  Skin:  Negative for rash.  Neurological:  Negative for tremors and numbness.  Hematological:  Negative for adenopathy. Does not bruise/bleed easily.  Psychiatric/Behavioral:  Negative for behavioral problems (Depression), sleep disturbance and suicidal ideas. The patient is not nervous/anxious.     Vital Signs: BP 114/70   Pulse 69   Temp (!) 96.4 F (35.8 C)   Resp 16   Ht 5' 8 (1.727 m)   Wt 195 lb (88.5 kg)   SpO2 93%   BMI 29.65 kg/m    Physical Exam Vitals reviewed.  Constitutional:      General: He is not in acute distress.    Appearance: Normal appearance. He is obese. He is not ill-appearing.  HENT:     Head: Normocephalic and atraumatic.  Eyes:     Pupils: Pupils are equal, round, and reactive to light.  Cardiovascular:     Rate and Rhythm: Normal rate and regular rhythm.  Pulmonary:     Effort: Pulmonary effort is normal. No respiratory distress.  Neurological:     Mental Status: He is alert and oriented to person, place, and time.  Psychiatric:        Mood and Affect: Mood  normal.        Behavior: Behavior normal.        Assessment/Plan: 1. NAFLD (nonalcoholic fatty liver disease) (Primary) Still present on liver ultrasound but stable.   2. Hyperkalemia Resolved   3. Type 2 diabetes mellitus with other specified complication, with long-term current use of insulin  (HCC) Basal insulin  changed to tresiba from lantus  due to insurance formulary changes  4. Hyperlipidemia associated with type 2 diabetes mellitus (HCC) Continue statin therapy    General Counseling: Tyler Powers verbalizes understanding of the findings of todays visit and agrees with plan of treatment. I have  discussed any further diagnostic evaluation that may be needed or ordered today. We also reviewed his medications today. he has been encouraged to call the office with any questions or concerns that should arise related to todays visit.    No orders of the defined types were placed in this encounter.   No orders of the defined types were placed in this encounter.   Return for previously scheduled, F/U, Recheck A1C, Winifred Bodiford PCP in march .   Total time spent:30 Minutes Time spent includes review of chart, medications, test results, and follow up plan with the patient.   [place] Controlled Substance Database was reviewed by me.  This patient was seen by Mardy Maxin, FNP-C in collaboration with Dr. Sigrid Bathe as a part of collaborative care agreement.   Marlow Berenguer R. Maxin, MSN, FNP-C Internal medicine

## 2024-04-26 ENCOUNTER — Encounter: Payer: Self-pay | Admitting: Nurse Practitioner

## 2024-07-15 ENCOUNTER — Ambulatory Visit: Admitting: Nurse Practitioner

## 2025-01-24 ENCOUNTER — Ambulatory Visit: Admitting: Dermatology

## 2025-03-20 ENCOUNTER — Encounter: Admitting: Nurse Practitioner
